# Patient Record
Sex: Female | Born: 1950 | Race: White | Hispanic: No | Marital: Married | Smoking: Never smoker
Health system: Southern US, Community
[De-identification: ages and names within clinical notes are randomized; demographics above are authoritative.]

## PROBLEM LIST (undated history)

## (undated) DIAGNOSIS — M8580 Other specified disorders of bone density and structure, unspecified site: Secondary | ICD-10-CM

## (undated) DIAGNOSIS — N952 Postmenopausal atrophic vaginitis: Secondary | ICD-10-CM

## (undated) HISTORY — DX: Other specified disorders of bone density and structure, unspecified site: M85.80

## (undated) HISTORY — DX: Postmenopausal atrophic vaginitis: N95.2

## (undated) HISTORY — PX: TUBAL LIGATION: SHX77

## (undated) HISTORY — PX: WISDOM TOOTH EXTRACTION: SHX21

---

## 1999-11-05 ENCOUNTER — Other Ambulatory Visit: Admission: RE | Admit: 1999-11-05 | Discharge: 1999-11-05 | Payer: Self-pay | Admitting: Obstetrics and Gynecology

## 2000-01-20 ENCOUNTER — Encounter: Admission: RE | Admit: 2000-01-20 | Discharge: 2000-01-20 | Payer: Self-pay | Admitting: General Surgery

## 2000-01-20 ENCOUNTER — Encounter: Payer: Self-pay | Admitting: General Surgery

## 2000-01-20 ENCOUNTER — Encounter (INDEPENDENT_AMBULATORY_CARE_PROVIDER_SITE_OTHER): Payer: Self-pay | Admitting: Specialist

## 2000-01-20 ENCOUNTER — Other Ambulatory Visit: Admission: RE | Admit: 2000-01-20 | Discharge: 2000-01-20 | Payer: Self-pay

## 2000-08-04 ENCOUNTER — Encounter: Payer: Self-pay | Admitting: Obstetrics and Gynecology

## 2000-08-04 ENCOUNTER — Encounter: Admission: RE | Admit: 2000-08-04 | Discharge: 2000-08-04 | Payer: Self-pay | Admitting: Obstetrics and Gynecology

## 2000-09-28 ENCOUNTER — Encounter: Admission: RE | Admit: 2000-09-28 | Discharge: 2000-09-28 | Payer: Self-pay | Admitting: Obstetrics and Gynecology

## 2000-09-28 ENCOUNTER — Encounter: Payer: Self-pay | Admitting: Obstetrics and Gynecology

## 2001-01-04 ENCOUNTER — Other Ambulatory Visit: Admission: RE | Admit: 2001-01-04 | Discharge: 2001-01-04 | Payer: Self-pay | Admitting: Obstetrics and Gynecology

## 2001-10-06 ENCOUNTER — Encounter: Admission: RE | Admit: 2001-10-06 | Discharge: 2001-10-06 | Payer: Self-pay | Admitting: Obstetrics and Gynecology

## 2001-10-06 ENCOUNTER — Encounter: Payer: Self-pay | Admitting: Obstetrics and Gynecology

## 2002-02-01 ENCOUNTER — Other Ambulatory Visit: Admission: RE | Admit: 2002-02-01 | Discharge: 2002-02-01 | Payer: Self-pay | Admitting: Obstetrics and Gynecology

## 2002-10-11 ENCOUNTER — Encounter: Admission: RE | Admit: 2002-10-11 | Discharge: 2002-10-11 | Payer: Self-pay | Admitting: Obstetrics and Gynecology

## 2002-10-11 ENCOUNTER — Encounter: Payer: Self-pay | Admitting: Obstetrics and Gynecology

## 2003-04-02 ENCOUNTER — Other Ambulatory Visit: Admission: RE | Admit: 2003-04-02 | Discharge: 2003-04-02 | Payer: Self-pay | Admitting: Obstetrics and Gynecology

## 2005-03-04 ENCOUNTER — Encounter: Admission: RE | Admit: 2005-03-04 | Discharge: 2005-03-04 | Payer: Self-pay | Admitting: Obstetrics and Gynecology

## 2005-03-25 ENCOUNTER — Ambulatory Visit: Payer: Self-pay | Admitting: Gastroenterology

## 2005-04-16 ENCOUNTER — Ambulatory Visit: Payer: Self-pay | Admitting: Gastroenterology

## 2005-06-16 ENCOUNTER — Ambulatory Visit: Payer: Self-pay | Admitting: Gastroenterology

## 2005-07-02 ENCOUNTER — Ambulatory Visit: Payer: Self-pay | Admitting: Gastroenterology

## 2005-07-08 ENCOUNTER — Other Ambulatory Visit: Admission: RE | Admit: 2005-07-08 | Discharge: 2005-07-08 | Payer: Self-pay | Admitting: Obstetrics and Gynecology

## 2006-04-19 ENCOUNTER — Encounter: Admission: RE | Admit: 2006-04-19 | Discharge: 2006-04-19 | Payer: Self-pay | Admitting: Obstetrics and Gynecology

## 2007-04-28 ENCOUNTER — Encounter: Admission: RE | Admit: 2007-04-28 | Discharge: 2007-04-28 | Payer: Self-pay | Admitting: Obstetrics and Gynecology

## 2008-07-02 ENCOUNTER — Encounter: Admission: RE | Admit: 2008-07-02 | Discharge: 2008-07-02 | Payer: Self-pay | Admitting: Obstetrics and Gynecology

## 2008-07-10 ENCOUNTER — Encounter: Admission: RE | Admit: 2008-07-10 | Discharge: 2008-07-10 | Payer: Self-pay | Admitting: Obstetrics and Gynecology

## 2009-09-06 ENCOUNTER — Encounter: Admission: RE | Admit: 2009-09-06 | Discharge: 2009-09-06 | Payer: Self-pay | Admitting: Obstetrics and Gynecology

## 2010-10-31 ENCOUNTER — Other Ambulatory Visit: Payer: Self-pay | Admitting: Obstetrics and Gynecology

## 2010-10-31 DIAGNOSIS — Z1231 Encounter for screening mammogram for malignant neoplasm of breast: Secondary | ICD-10-CM

## 2010-11-26 ENCOUNTER — Ambulatory Visit
Admission: RE | Admit: 2010-11-26 | Discharge: 2010-11-26 | Disposition: A | Discharge status: Home / Self Care | Payer: Federal, State, Local not specified - PPO | Source: Ambulatory Visit | Attending: Obstetrics and Gynecology | Admitting: Obstetrics and Gynecology

## 2010-11-26 DIAGNOSIS — Z1231 Encounter for screening mammogram for malignant neoplasm of breast: Secondary | ICD-10-CM

## 2012-03-17 ENCOUNTER — Other Ambulatory Visit: Payer: Self-pay | Admitting: Obstetrics and Gynecology

## 2012-03-17 DIAGNOSIS — Z1231 Encounter for screening mammogram for malignant neoplasm of breast: Secondary | ICD-10-CM

## 2012-03-22 ENCOUNTER — Ambulatory Visit: Payer: Self-pay | Admitting: Obstetrics and Gynecology

## 2012-04-21 ENCOUNTER — Ambulatory Visit
Admission: RE | Admit: 2012-04-21 | Discharge: 2012-04-21 | Disposition: A | Discharge status: Home / Self Care | Payer: Self-pay | Source: Ambulatory Visit | Attending: Obstetrics and Gynecology | Admitting: Obstetrics and Gynecology

## 2012-04-21 DIAGNOSIS — Z1231 Encounter for screening mammogram for malignant neoplasm of breast: Secondary | ICD-10-CM

## 2012-05-25 ENCOUNTER — Ambulatory Visit (INDEPENDENT_AMBULATORY_CARE_PROVIDER_SITE_OTHER): Payer: BC Managed Care – PPO | Admitting: Obstetrics and Gynecology

## 2012-05-25 ENCOUNTER — Encounter: Payer: Self-pay | Admitting: Obstetrics and Gynecology

## 2012-05-25 VITALS — BP 100/60 | HR 76 | Ht 64.5 in | Wt 133.0 lb

## 2012-05-25 DIAGNOSIS — L900 Lichen sclerosus et atrophicus: Secondary | ICD-10-CM

## 2012-05-25 DIAGNOSIS — L940 Localized scleroderma [morphea]: Secondary | ICD-10-CM

## 2012-05-25 DIAGNOSIS — Z01419 Encounter for gynecological examination (general) (routine) without abnormal findings: Secondary | ICD-10-CM

## 2012-05-25 DIAGNOSIS — M8580 Other specified disorders of bone density and structure, unspecified site: Secondary | ICD-10-CM | POA: Insufficient documentation

## 2012-05-25 DIAGNOSIS — N952 Postmenopausal atrophic vaginitis: Secondary | ICD-10-CM | POA: Insufficient documentation

## 2012-05-25 DIAGNOSIS — Z124 Encounter for screening for malignant neoplasm of cervix: Secondary | ICD-10-CM

## 2012-05-25 HISTORY — DX: Lichen sclerosus et atrophicus: L90.0

## 2012-05-25 MED ORDER — ESTRADIOL 10 MCG VA TABS: 1.0000 | ORAL_TABLET | VAGINAL | Status: AC

## 2012-05-25 NOTE — Progress Notes (Signed)
The patient has never been taking hormone replacement therapy The patient  is taking a Calcium supplement. Post-menopausal bleeding:no  Last Pap: was normal July  2012 Last mammogram: was normal September  2013 Last DEXA scan : T= -2.04 February 2011 Last colonoscopy:was normal December 2003 Pt is scheduled for next colonoscopy December 2013  Urinary symptoms: none Normal bowel movements: Yes Reports abuse at home: No  Subjective:    Joann Curtis is a 61 y.o. female G3P3 who presents for annual exam. Known for vaginal atrophy and osteopenia The patient has no complaints today.   The following portions of the patient's history were reviewed and updated as appropriate: allergies, current medications, past family history, past medical history, past social history, past surgical history and problem list.  Review of Systems Pertinent items are noted in HPI. Gastrointestinal:No change in bowel habits, no abdominal pain, no rectal bleeding Genitourinary:negative for dysuria, frequency, hematuria, nocturia and urinary incontinence    Objective:     BP 100/60  Pulse 76  Ht 5' 4.5" (1.638 m)  Wt 133 lb (60.328 kg)  BMI 22.48 kg/m2  Weight:  Wt Readings from Last 1 Encounters:  05/25/12 133 lb (60.328 kg)     BMI: Body mass index is 22.48 kg/(m^2). General Appearance: Alert, appropriate appearance for age. No acute distress HEENT: Grossly normal Neck / Thyroid: Supple, no masses, nodes or enlargement Lungs: clear to auscultation bilaterally Back: No CVA tenderness Breast Exam: No masses or nodes.No dimpling, nipple retraction or discharge. Cardiovascular: Regular rate and rhythm. S1, S2, no murmur Gastrointestinal: Soft, non-tender, no masses or organomegaly Pelvic Exam: Vulva and vagina moderately atrophic. Bimanual exam reveals normal uterus and adnexa. Pt also treated for Lichen sclerosis by dermatologist Rectovaginal: normal rectal, no masses Lymphatic Exam: Non-palpable  nodes in neck, clavicular, axillary, or inguinal regions Skin: no rash or abnormalities Neurologic: Normal gait and speech, no tremor  Psychiatric: Alert and oriented, appropriate affect.   Assessment:    Normal gyn exam    Plan:   mammogram pap smear return annually or prn DEXA next year Follow-up:  for annual exam   Silverio Lay MD

## 2012-05-26 LAB — PAP IG W/ RFLX HPV ASCU

## 2012-08-01 ENCOUNTER — Encounter: Payer: Self-pay | Admitting: Internal Medicine

## 2012-12-07 HISTORY — PX: COLONOSCOPY: SHX174

## 2013-02-15 ENCOUNTER — Encounter: Payer: Self-pay | Admitting: Internal Medicine

## 2013-04-06 ENCOUNTER — Other Ambulatory Visit: Payer: Self-pay

## 2013-04-06 DIAGNOSIS — Z1231 Encounter for screening mammogram for malignant neoplasm of breast: Secondary | ICD-10-CM

## 2013-05-11 ENCOUNTER — Ambulatory Visit: Payer: Self-pay

## 2013-05-15 ENCOUNTER — Ambulatory Visit
Admission: RE | Admit: 2013-05-15 | Discharge: 2013-05-15 | Disposition: A | Discharge status: Home / Self Care | Payer: BC Managed Care – PPO | Source: Ambulatory Visit

## 2013-05-15 DIAGNOSIS — Z1231 Encounter for screening mammogram for malignant neoplasm of breast: Secondary | ICD-10-CM

## 2014-06-04 ENCOUNTER — Encounter: Payer: Self-pay | Admitting: Obstetrics and Gynecology

## 2014-07-13 ENCOUNTER — Other Ambulatory Visit: Payer: Self-pay

## 2014-07-13 DIAGNOSIS — Z1231 Encounter for screening mammogram for malignant neoplasm of breast: Secondary | ICD-10-CM

## 2014-08-22 ENCOUNTER — Ambulatory Visit
Admission: RE | Admit: 2014-08-22 | Discharge: 2014-08-22 | Disposition: A | Discharge status: Home / Self Care | Payer: Medicare HMO | Source: Ambulatory Visit

## 2014-08-22 ENCOUNTER — Encounter (INDEPENDENT_AMBULATORY_CARE_PROVIDER_SITE_OTHER): Payer: Self-pay

## 2014-08-22 DIAGNOSIS — Z1231 Encounter for screening mammogram for malignant neoplasm of breast: Secondary | ICD-10-CM

## 2015-08-23 ENCOUNTER — Other Ambulatory Visit: Payer: Self-pay

## 2015-08-23 DIAGNOSIS — Z1231 Encounter for screening mammogram for malignant neoplasm of breast: Secondary | ICD-10-CM

## 2015-10-09 ENCOUNTER — Ambulatory Visit
Admission: RE | Admit: 2015-10-09 | Discharge: 2015-10-09 | Disposition: A | Discharge status: Home / Self Care | Payer: PPO | Source: Ambulatory Visit

## 2015-10-09 DIAGNOSIS — Z1231 Encounter for screening mammogram for malignant neoplasm of breast: Secondary | ICD-10-CM

## 2016-08-11 DIAGNOSIS — Z0000 Encounter for general adult medical examination without abnormal findings: Secondary | ICD-10-CM | POA: Diagnosis not present

## 2016-08-11 DIAGNOSIS — J329 Chronic sinusitis, unspecified: Secondary | ICD-10-CM | POA: Diagnosis not present

## 2016-09-01 DIAGNOSIS — L900 Lichen sclerosus et atrophicus: Secondary | ICD-10-CM | POA: Diagnosis not present

## 2016-09-01 DIAGNOSIS — N952 Postmenopausal atrophic vaginitis: Secondary | ICD-10-CM | POA: Diagnosis not present

## 2016-09-14 DIAGNOSIS — Z6822 Body mass index (BMI) 22.0-22.9, adult: Secondary | ICD-10-CM | POA: Diagnosis not present

## 2016-09-14 DIAGNOSIS — N952 Postmenopausal atrophic vaginitis: Secondary | ICD-10-CM | POA: Diagnosis not present

## 2016-09-14 DIAGNOSIS — Z01419 Encounter for gynecological examination (general) (routine) without abnormal findings: Secondary | ICD-10-CM | POA: Diagnosis not present

## 2016-09-14 DIAGNOSIS — M8580 Other specified disorders of bone density and structure, unspecified site: Secondary | ICD-10-CM | POA: Diagnosis not present

## 2016-09-23 ENCOUNTER — Other Ambulatory Visit: Payer: Self-pay | Admitting: Family Medicine

## 2016-09-23 DIAGNOSIS — Z1231 Encounter for screening mammogram for malignant neoplasm of breast: Secondary | ICD-10-CM

## 2016-10-15 ENCOUNTER — Ambulatory Visit
Admission: RE | Admit: 2016-10-15 | Discharge: 2016-10-15 | Disposition: A | Discharge status: Home / Self Care | Payer: Medicare HMO | Source: Ambulatory Visit | Attending: Family Medicine | Admitting: Family Medicine

## 2016-10-15 DIAGNOSIS — Z1231 Encounter for screening mammogram for malignant neoplasm of breast: Secondary | ICD-10-CM | POA: Diagnosis not present

## 2016-12-01 DIAGNOSIS — Z13220 Encounter for screening for lipoid disorders: Secondary | ICD-10-CM | POA: Diagnosis not present

## 2016-12-01 DIAGNOSIS — E785 Hyperlipidemia, unspecified: Secondary | ICD-10-CM | POA: Diagnosis not present

## 2016-12-01 DIAGNOSIS — R5383 Other fatigue: Secondary | ICD-10-CM | POA: Diagnosis not present

## 2016-12-01 DIAGNOSIS — Z6821 Body mass index (BMI) 21.0-21.9, adult: Secondary | ICD-10-CM | POA: Diagnosis not present

## 2016-12-01 DIAGNOSIS — N390 Urinary tract infection, site not specified: Secondary | ICD-10-CM | POA: Diagnosis not present

## 2016-12-15 DIAGNOSIS — Z6821 Body mass index (BMI) 21.0-21.9, adult: Secondary | ICD-10-CM | POA: Diagnosis not present

## 2016-12-15 DIAGNOSIS — J029 Acute pharyngitis, unspecified: Secondary | ICD-10-CM | POA: Diagnosis not present

## 2016-12-21 DIAGNOSIS — Z6821 Body mass index (BMI) 21.0-21.9, adult: Secondary | ICD-10-CM | POA: Diagnosis not present

## 2016-12-21 DIAGNOSIS — N390 Urinary tract infection, site not specified: Secondary | ICD-10-CM | POA: Diagnosis not present

## 2016-12-21 DIAGNOSIS — K5900 Constipation, unspecified: Secondary | ICD-10-CM | POA: Diagnosis not present

## 2017-01-04 DIAGNOSIS — N390 Urinary tract infection, site not specified: Secondary | ICD-10-CM | POA: Diagnosis not present

## 2017-01-05 DIAGNOSIS — N390 Urinary tract infection, site not specified: Secondary | ICD-10-CM | POA: Diagnosis not present

## 2017-02-04 DIAGNOSIS — R238 Other skin changes: Secondary | ICD-10-CM | POA: Diagnosis not present

## 2017-02-04 DIAGNOSIS — Z1389 Encounter for screening for other disorder: Secondary | ICD-10-CM | POA: Diagnosis not present

## 2017-02-04 DIAGNOSIS — N3090 Cystitis, unspecified without hematuria: Secondary | ICD-10-CM | POA: Diagnosis not present

## 2017-02-04 DIAGNOSIS — Z6821 Body mass index (BMI) 21.0-21.9, adult: Secondary | ICD-10-CM | POA: Diagnosis not present

## 2017-02-04 DIAGNOSIS — Z9181 History of falling: Secondary | ICD-10-CM | POA: Diagnosis not present

## 2017-02-04 DIAGNOSIS — L659 Nonscarring hair loss, unspecified: Secondary | ICD-10-CM | POA: Diagnosis not present

## 2017-02-04 DIAGNOSIS — R5383 Other fatigue: Secondary | ICD-10-CM | POA: Diagnosis not present

## 2017-02-04 DIAGNOSIS — Z23 Encounter for immunization: Secondary | ICD-10-CM | POA: Diagnosis not present

## 2017-04-06 DIAGNOSIS — Z23 Encounter for immunization: Secondary | ICD-10-CM | POA: Diagnosis not present

## 2017-04-06 DIAGNOSIS — L650 Telogen effluvium: Secondary | ICD-10-CM | POA: Diagnosis not present

## 2017-04-06 DIAGNOSIS — L821 Other seborrheic keratosis: Secondary | ICD-10-CM | POA: Diagnosis not present

## 2017-04-06 DIAGNOSIS — Z6821 Body mass index (BMI) 21.0-21.9, adult: Secondary | ICD-10-CM | POA: Diagnosis not present

## 2017-04-06 DIAGNOSIS — L900 Lichen sclerosus et atrophicus: Secondary | ICD-10-CM | POA: Diagnosis not present

## 2017-04-06 DIAGNOSIS — N390 Urinary tract infection, site not specified: Secondary | ICD-10-CM | POA: Diagnosis not present

## 2017-06-09 DIAGNOSIS — H5213 Myopia, bilateral: Secondary | ICD-10-CM | POA: Diagnosis not present

## 2017-06-09 DIAGNOSIS — H21233 Degeneration of iris (pigmentary), bilateral: Secondary | ICD-10-CM | POA: Diagnosis not present

## 2017-06-09 DIAGNOSIS — H2513 Age-related nuclear cataract, bilateral: Secondary | ICD-10-CM | POA: Diagnosis not present

## 2017-06-15 DIAGNOSIS — Z23 Encounter for immunization: Secondary | ICD-10-CM | POA: Diagnosis not present

## 2017-06-28 DIAGNOSIS — S93491A Sprain of other ligament of right ankle, initial encounter: Secondary | ICD-10-CM | POA: Diagnosis not present

## 2017-08-31 DIAGNOSIS — L900 Lichen sclerosus et atrophicus: Secondary | ICD-10-CM | POA: Diagnosis not present

## 2017-09-14 DIAGNOSIS — H1032 Unspecified acute conjunctivitis, left eye: Secondary | ICD-10-CM | POA: Diagnosis not present

## 2017-09-14 DIAGNOSIS — H0100A Unspecified blepharitis right eye, upper and lower eyelids: Secondary | ICD-10-CM | POA: Diagnosis not present

## 2017-09-14 DIAGNOSIS — H0100B Unspecified blepharitis left eye, upper and lower eyelids: Secondary | ICD-10-CM | POA: Diagnosis not present

## 2017-09-30 DIAGNOSIS — M810 Age-related osteoporosis without current pathological fracture: Secondary | ICD-10-CM | POA: Diagnosis not present

## 2017-09-30 DIAGNOSIS — N952 Postmenopausal atrophic vaginitis: Secondary | ICD-10-CM | POA: Diagnosis not present

## 2017-09-30 DIAGNOSIS — Z01419 Encounter for gynecological examination (general) (routine) without abnormal findings: Secondary | ICD-10-CM | POA: Diagnosis not present

## 2017-09-30 DIAGNOSIS — Z1239 Encounter for other screening for malignant neoplasm of breast: Secondary | ICD-10-CM | POA: Diagnosis not present

## 2017-09-30 DIAGNOSIS — Z1211 Encounter for screening for malignant neoplasm of colon: Secondary | ICD-10-CM | POA: Diagnosis not present

## 2017-09-30 DIAGNOSIS — N904 Leukoplakia of vulva: Secondary | ICD-10-CM | POA: Diagnosis not present

## 2017-09-30 DIAGNOSIS — Z6822 Body mass index (BMI) 22.0-22.9, adult: Secondary | ICD-10-CM | POA: Diagnosis not present

## 2017-09-30 DIAGNOSIS — M8589 Other specified disorders of bone density and structure, multiple sites: Secondary | ICD-10-CM | POA: Diagnosis not present

## 2017-10-14 ENCOUNTER — Other Ambulatory Visit: Payer: Self-pay | Admitting: Obstetrics and Gynecology

## 2017-10-14 DIAGNOSIS — Z1231 Encounter for screening mammogram for malignant neoplasm of breast: Secondary | ICD-10-CM

## 2017-11-10 ENCOUNTER — Ambulatory Visit: Payer: Medicare HMO

## 2017-11-16 ENCOUNTER — Ambulatory Visit
Admission: RE | Admit: 2017-11-16 | Discharge: 2017-11-16 | Disposition: A | Discharge status: Home / Self Care | Payer: Medicare HMO | Source: Ambulatory Visit | Attending: Obstetrics and Gynecology | Admitting: Obstetrics and Gynecology

## 2017-11-16 DIAGNOSIS — Z1231 Encounter for screening mammogram for malignant neoplasm of breast: Secondary | ICD-10-CM

## 2017-12-07 DIAGNOSIS — L900 Lichen sclerosus et atrophicus: Secondary | ICD-10-CM | POA: Diagnosis not present

## 2018-01-11 DIAGNOSIS — E559 Vitamin D deficiency, unspecified: Secondary | ICD-10-CM | POA: Diagnosis not present

## 2018-06-16 DIAGNOSIS — D225 Melanocytic nevi of trunk: Secondary | ICD-10-CM | POA: Diagnosis not present

## 2018-06-16 DIAGNOSIS — L658 Other specified nonscarring hair loss: Secondary | ICD-10-CM | POA: Diagnosis not present

## 2018-06-16 DIAGNOSIS — Z23 Encounter for immunization: Secondary | ICD-10-CM | POA: Diagnosis not present

## 2018-06-16 DIAGNOSIS — L821 Other seborrheic keratosis: Secondary | ICD-10-CM | POA: Diagnosis not present

## 2018-06-16 DIAGNOSIS — L900 Lichen sclerosus et atrophicus: Secondary | ICD-10-CM | POA: Diagnosis not present

## 2018-06-16 DIAGNOSIS — L814 Other melanin hyperpigmentation: Secondary | ICD-10-CM | POA: Diagnosis not present

## 2018-06-21 DIAGNOSIS — H2513 Age-related nuclear cataract, bilateral: Secondary | ICD-10-CM | POA: Diagnosis not present

## 2018-06-21 DIAGNOSIS — H5213 Myopia, bilateral: Secondary | ICD-10-CM | POA: Diagnosis not present

## 2018-07-07 DIAGNOSIS — Z23 Encounter for immunization: Secondary | ICD-10-CM | POA: Diagnosis not present

## 2018-08-18 DIAGNOSIS — L659 Nonscarring hair loss, unspecified: Secondary | ICD-10-CM | POA: Diagnosis not present

## 2018-08-18 DIAGNOSIS — Z6823 Body mass index (BMI) 23.0-23.9, adult: Secondary | ICD-10-CM | POA: Diagnosis not present

## 2018-08-18 DIAGNOSIS — E785 Hyperlipidemia, unspecified: Secondary | ICD-10-CM | POA: Diagnosis not present

## 2018-08-18 DIAGNOSIS — E049 Nontoxic goiter, unspecified: Secondary | ICD-10-CM | POA: Diagnosis not present

## 2018-08-18 DIAGNOSIS — Z0000 Encounter for general adult medical examination without abnormal findings: Secondary | ICD-10-CM | POA: Diagnosis not present

## 2018-08-18 DIAGNOSIS — R5383 Other fatigue: Secondary | ICD-10-CM | POA: Diagnosis not present

## 2018-08-18 DIAGNOSIS — Z13220 Encounter for screening for lipoid disorders: Secondary | ICD-10-CM | POA: Diagnosis not present

## 2018-08-30 DIAGNOSIS — N952 Postmenopausal atrophic vaginitis: Secondary | ICD-10-CM | POA: Diagnosis not present

## 2018-08-30 DIAGNOSIS — L900 Lichen sclerosus et atrophicus: Secondary | ICD-10-CM | POA: Diagnosis not present

## 2018-09-01 DIAGNOSIS — Z0000 Encounter for general adult medical examination without abnormal findings: Secondary | ICD-10-CM | POA: Diagnosis not present

## 2018-09-01 DIAGNOSIS — E049 Nontoxic goiter, unspecified: Secondary | ICD-10-CM | POA: Diagnosis not present

## 2018-09-16 DIAGNOSIS — R5383 Other fatigue: Secondary | ICD-10-CM | POA: Diagnosis not present

## 2018-09-16 DIAGNOSIS — R5381 Other malaise: Secondary | ICD-10-CM | POA: Diagnosis not present

## 2018-09-16 DIAGNOSIS — R42 Dizziness and giddiness: Secondary | ICD-10-CM | POA: Diagnosis not present

## 2018-09-16 DIAGNOSIS — Z6822 Body mass index (BMI) 22.0-22.9, adult: Secondary | ICD-10-CM | POA: Diagnosis not present

## 2018-10-14 ENCOUNTER — Other Ambulatory Visit: Payer: Self-pay | Admitting: Obstetrics and Gynecology

## 2018-10-14 DIAGNOSIS — Z1231 Encounter for screening mammogram for malignant neoplasm of breast: Secondary | ICD-10-CM

## 2018-11-25 ENCOUNTER — Ambulatory Visit: Payer: Medicare HMO

## 2019-01-24 DIAGNOSIS — W57XXXA Bitten or stung by nonvenomous insect and other nonvenomous arthropods, initial encounter: Secondary | ICD-10-CM | POA: Diagnosis not present

## 2019-01-24 DIAGNOSIS — Z6823 Body mass index (BMI) 23.0-23.9, adult: Secondary | ICD-10-CM | POA: Diagnosis not present

## 2019-01-24 DIAGNOSIS — R21 Rash and other nonspecific skin eruption: Secondary | ICD-10-CM | POA: Diagnosis not present

## 2019-01-26 ENCOUNTER — Other Ambulatory Visit: Payer: Self-pay

## 2019-01-26 ENCOUNTER — Ambulatory Visit
Admission: RE | Admit: 2019-01-26 | Discharge: 2019-01-26 | Disposition: A | Discharge status: Home / Self Care | Payer: Medicare HMO | Source: Ambulatory Visit | Attending: Obstetrics and Gynecology | Admitting: Obstetrics and Gynecology

## 2019-01-26 DIAGNOSIS — Z1231 Encounter for screening mammogram for malignant neoplasm of breast: Secondary | ICD-10-CM

## 2019-03-20 DIAGNOSIS — N952 Postmenopausal atrophic vaginitis: Secondary | ICD-10-CM | POA: Diagnosis not present

## 2019-03-20 DIAGNOSIS — L814 Other melanin hyperpigmentation: Secondary | ICD-10-CM | POA: Diagnosis not present

## 2019-03-20 DIAGNOSIS — Z1211 Encounter for screening for malignant neoplasm of colon: Secondary | ICD-10-CM | POA: Diagnosis not present

## 2019-03-20 DIAGNOSIS — L900 Lichen sclerosus et atrophicus: Secondary | ICD-10-CM | POA: Diagnosis not present

## 2019-03-20 DIAGNOSIS — Z1239 Encounter for other screening for malignant neoplasm of breast: Secondary | ICD-10-CM | POA: Diagnosis not present

## 2019-03-20 DIAGNOSIS — Z01419 Encounter for gynecological examination (general) (routine) without abnormal findings: Secondary | ICD-10-CM | POA: Diagnosis not present

## 2019-03-20 DIAGNOSIS — Z6822 Body mass index (BMI) 22.0-22.9, adult: Secondary | ICD-10-CM | POA: Diagnosis not present

## 2019-03-20 DIAGNOSIS — M810 Age-related osteoporosis without current pathological fracture: Secondary | ICD-10-CM | POA: Diagnosis not present

## 2019-03-20 DIAGNOSIS — L309 Dermatitis, unspecified: Secondary | ICD-10-CM | POA: Diagnosis not present

## 2019-03-20 DIAGNOSIS — L821 Other seborrheic keratosis: Secondary | ICD-10-CM | POA: Diagnosis not present

## 2019-03-20 DIAGNOSIS — D225 Melanocytic nevi of trunk: Secondary | ICD-10-CM | POA: Diagnosis not present

## 2019-03-20 DIAGNOSIS — Z124 Encounter for screening for malignant neoplasm of cervix: Secondary | ICD-10-CM | POA: Diagnosis not present

## 2019-05-11 DIAGNOSIS — Z23 Encounter for immunization: Secondary | ICD-10-CM | POA: Diagnosis not present

## 2019-07-11 DIAGNOSIS — H2513 Age-related nuclear cataract, bilateral: Secondary | ICD-10-CM | POA: Diagnosis not present

## 2019-07-11 DIAGNOSIS — H5213 Myopia, bilateral: Secondary | ICD-10-CM | POA: Diagnosis not present

## 2019-07-21 DIAGNOSIS — Z03818 Encounter for observation for suspected exposure to other biological agents ruled out: Secondary | ICD-10-CM | POA: Diagnosis not present

## 2019-08-29 DIAGNOSIS — L900 Lichen sclerosus et atrophicus: Secondary | ICD-10-CM | POA: Diagnosis not present

## 2019-08-29 DIAGNOSIS — N952 Postmenopausal atrophic vaginitis: Secondary | ICD-10-CM | POA: Diagnosis not present

## 2019-10-02 DIAGNOSIS — Z13220 Encounter for screening for lipoid disorders: Secondary | ICD-10-CM | POA: Diagnosis not present

## 2019-10-02 DIAGNOSIS — R5383 Other fatigue: Secondary | ICD-10-CM | POA: Diagnosis not present

## 2019-10-02 DIAGNOSIS — Z6822 Body mass index (BMI) 22.0-22.9, adult: Secondary | ICD-10-CM | POA: Diagnosis not present

## 2019-10-02 DIAGNOSIS — Z79899 Other long term (current) drug therapy: Secondary | ICD-10-CM | POA: Diagnosis not present

## 2019-10-02 DIAGNOSIS — E559 Vitamin D deficiency, unspecified: Secondary | ICD-10-CM | POA: Diagnosis not present

## 2019-10-02 DIAGNOSIS — E785 Hyperlipidemia, unspecified: Secondary | ICD-10-CM | POA: Diagnosis not present

## 2019-10-02 DIAGNOSIS — N390 Urinary tract infection, site not specified: Secondary | ICD-10-CM | POA: Diagnosis not present

## 2019-12-19 DIAGNOSIS — L249 Irritant contact dermatitis, unspecified cause: Secondary | ICD-10-CM | POA: Diagnosis not present

## 2019-12-20 ENCOUNTER — Other Ambulatory Visit: Payer: Self-pay | Admitting: Obstetrics and Gynecology

## 2019-12-20 DIAGNOSIS — Z1231 Encounter for screening mammogram for malignant neoplasm of breast: Secondary | ICD-10-CM

## 2020-01-26 DIAGNOSIS — N952 Postmenopausal atrophic vaginitis: Secondary | ICD-10-CM | POA: Diagnosis not present

## 2020-01-26 DIAGNOSIS — L900 Lichen sclerosus et atrophicus: Secondary | ICD-10-CM | POA: Diagnosis not present

## 2020-02-07 ENCOUNTER — Other Ambulatory Visit: Payer: Self-pay

## 2020-02-07 ENCOUNTER — Ambulatory Visit
Admission: RE | Admit: 2020-02-07 | Discharge: 2020-02-07 | Disposition: A | Discharge status: Home / Self Care | Payer: Medicare HMO | Source: Ambulatory Visit | Attending: Obstetrics and Gynecology | Admitting: Obstetrics and Gynecology

## 2020-02-07 DIAGNOSIS — Z1231 Encounter for screening mammogram for malignant neoplasm of breast: Secondary | ICD-10-CM

## 2020-02-09 ENCOUNTER — Other Ambulatory Visit: Payer: Self-pay | Admitting: Obstetrics and Gynecology

## 2020-02-09 DIAGNOSIS — R928 Other abnormal and inconclusive findings on diagnostic imaging of breast: Secondary | ICD-10-CM

## 2020-03-01 ENCOUNTER — Other Ambulatory Visit: Payer: Self-pay

## 2020-03-01 ENCOUNTER — Ambulatory Visit: Payer: Medicare HMO

## 2020-03-01 ENCOUNTER — Ambulatory Visit
Admission: RE | Admit: 2020-03-01 | Discharge: 2020-03-01 | Disposition: A | Discharge status: Home / Self Care | Payer: Medicare HMO | Source: Ambulatory Visit | Attending: Obstetrics and Gynecology | Admitting: Obstetrics and Gynecology

## 2020-03-01 DIAGNOSIS — R928 Other abnormal and inconclusive findings on diagnostic imaging of breast: Secondary | ICD-10-CM

## 2020-03-01 DIAGNOSIS — R922 Inconclusive mammogram: Secondary | ICD-10-CM | POA: Diagnosis not present

## 2020-04-03 DIAGNOSIS — D225 Melanocytic nevi of trunk: Secondary | ICD-10-CM | POA: Diagnosis not present

## 2020-04-03 DIAGNOSIS — L900 Lichen sclerosus et atrophicus: Secondary | ICD-10-CM | POA: Diagnosis not present

## 2020-04-03 DIAGNOSIS — L821 Other seborrheic keratosis: Secondary | ICD-10-CM | POA: Diagnosis not present

## 2020-04-03 DIAGNOSIS — L814 Other melanin hyperpigmentation: Secondary | ICD-10-CM | POA: Diagnosis not present

## 2020-04-03 DIAGNOSIS — L578 Other skin changes due to chronic exposure to nonionizing radiation: Secondary | ICD-10-CM | POA: Diagnosis not present

## 2020-04-09 DIAGNOSIS — Z1211 Encounter for screening for malignant neoplasm of colon: Secondary | ICD-10-CM | POA: Diagnosis not present

## 2020-04-09 DIAGNOSIS — M810 Age-related osteoporosis without current pathological fracture: Secondary | ICD-10-CM | POA: Diagnosis not present

## 2020-04-09 DIAGNOSIS — Z6822 Body mass index (BMI) 22.0-22.9, adult: Secondary | ICD-10-CM | POA: Diagnosis not present

## 2020-04-09 DIAGNOSIS — N952 Postmenopausal atrophic vaginitis: Secondary | ICD-10-CM | POA: Diagnosis not present

## 2020-04-09 DIAGNOSIS — M8580 Other specified disorders of bone density and structure, unspecified site: Secondary | ICD-10-CM | POA: Diagnosis not present

## 2020-04-09 DIAGNOSIS — L900 Lichen sclerosus et atrophicus: Secondary | ICD-10-CM | POA: Diagnosis not present

## 2020-04-09 DIAGNOSIS — Z01419 Encounter for gynecological examination (general) (routine) without abnormal findings: Secondary | ICD-10-CM | POA: Diagnosis not present

## 2020-04-09 DIAGNOSIS — Z1321 Encounter for screening for nutritional disorder: Secondary | ICD-10-CM | POA: Diagnosis not present

## 2020-04-09 DIAGNOSIS — Z1239 Encounter for other screening for malignant neoplasm of breast: Secondary | ICD-10-CM | POA: Diagnosis not present

## 2020-07-16 DIAGNOSIS — H5213 Myopia, bilateral: Secondary | ICD-10-CM | POA: Diagnosis not present

## 2020-07-16 DIAGNOSIS — H2513 Age-related nuclear cataract, bilateral: Secondary | ICD-10-CM | POA: Diagnosis not present

## 2020-07-16 DIAGNOSIS — H43813 Vitreous degeneration, bilateral: Secondary | ICD-10-CM | POA: Diagnosis not present

## 2020-07-19 DIAGNOSIS — R69 Illness, unspecified: Secondary | ICD-10-CM | POA: Diagnosis not present

## 2020-07-31 DIAGNOSIS — Z03818 Encounter for observation for suspected exposure to other biological agents ruled out: Secondary | ICD-10-CM | POA: Diagnosis not present

## 2020-07-31 DIAGNOSIS — Z20822 Contact with and (suspected) exposure to covid-19: Secondary | ICD-10-CM | POA: Diagnosis not present

## 2020-08-20 DIAGNOSIS — L900 Lichen sclerosus et atrophicus: Secondary | ICD-10-CM | POA: Diagnosis not present

## 2020-08-20 DIAGNOSIS — N952 Postmenopausal atrophic vaginitis: Secondary | ICD-10-CM | POA: Diagnosis not present

## 2020-10-15 DIAGNOSIS — N390 Urinary tract infection, site not specified: Secondary | ICD-10-CM | POA: Diagnosis not present

## 2020-10-15 DIAGNOSIS — Z6822 Body mass index (BMI) 22.0-22.9, adult: Secondary | ICD-10-CM | POA: Diagnosis not present

## 2020-10-15 DIAGNOSIS — Z0000 Encounter for general adult medical examination without abnormal findings: Secondary | ICD-10-CM | POA: Diagnosis not present

## 2020-11-19 DIAGNOSIS — L900 Lichen sclerosus et atrophicus: Secondary | ICD-10-CM | POA: Diagnosis not present

## 2020-11-19 DIAGNOSIS — N952 Postmenopausal atrophic vaginitis: Secondary | ICD-10-CM | POA: Diagnosis not present

## 2020-12-19 IMAGING — MG MM DIGITAL DIAGNOSTIC UNILAT*L* W/ TOMO W/ CAD
6 series · 6 of 18 positions shown · non-contrast
Comparison: 02/07/2020 and earlier

CLINICAL DATA: Patient returns after screening study for evaluation
possible LEFT breast asymmetry.

EXAM:
DIGITAL DIAGNOSTIC UNILATERAL LEFT MAMMOGRAM WITH TOMO AND CAD

[L CC synth-2D]
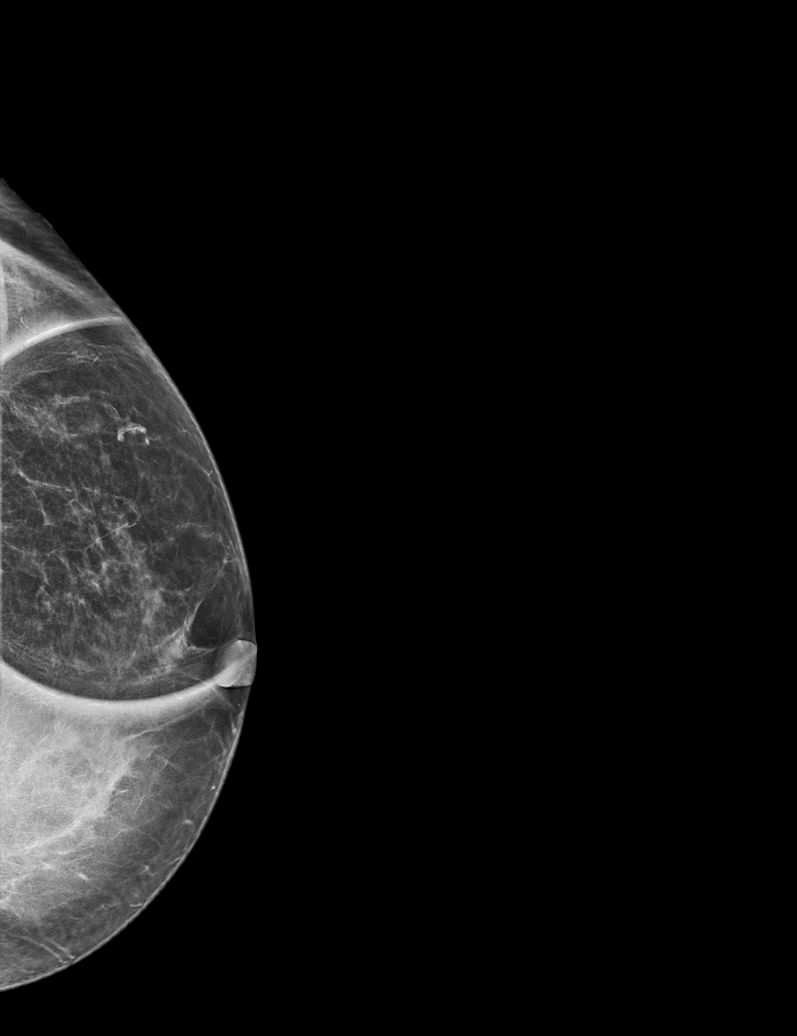

[L MLO synth-2D]
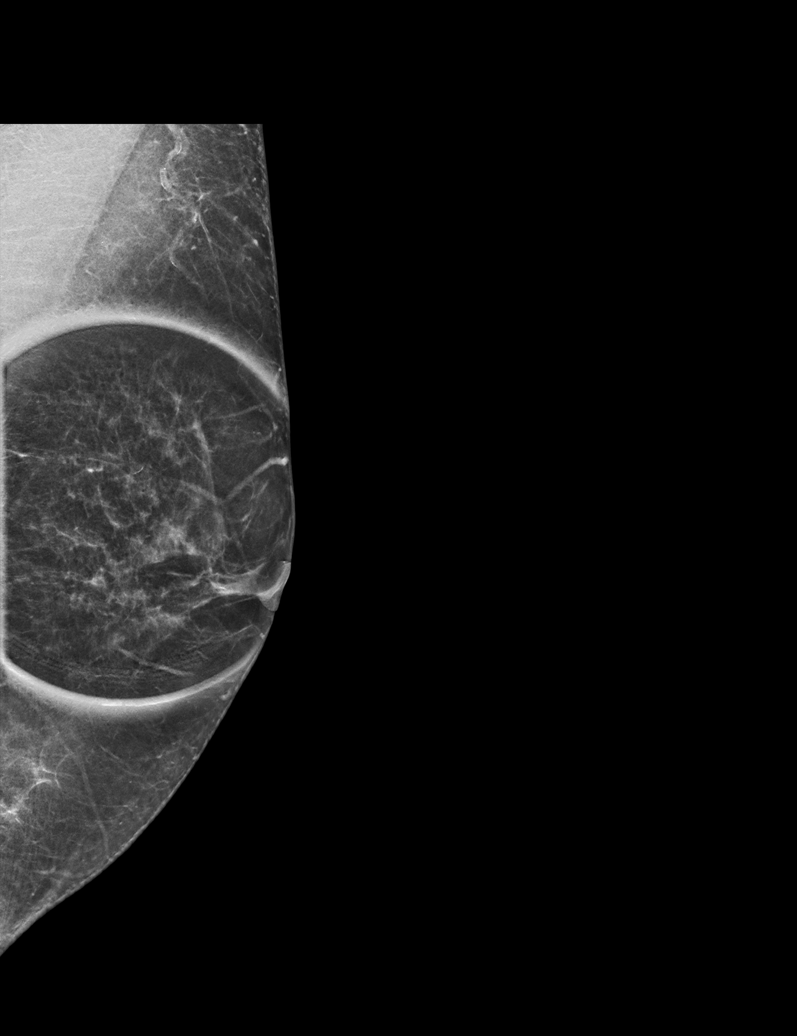

[L XCCL synth-2D]
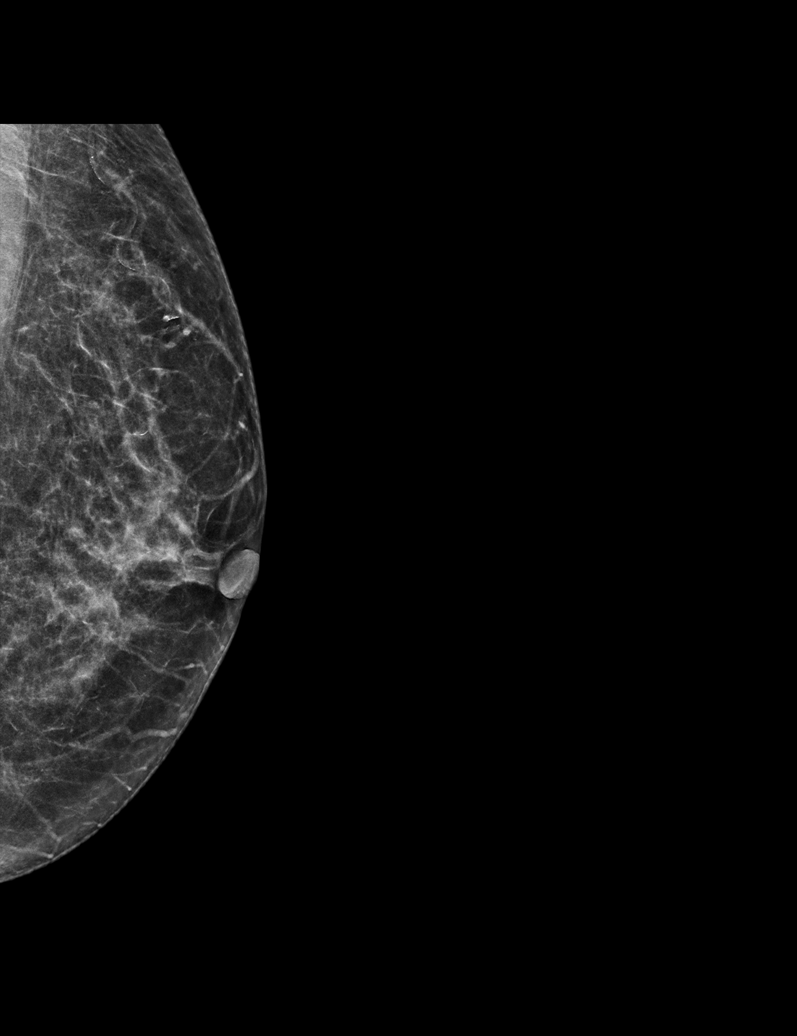

[L CC tomo · tomo slice 27/52.0]
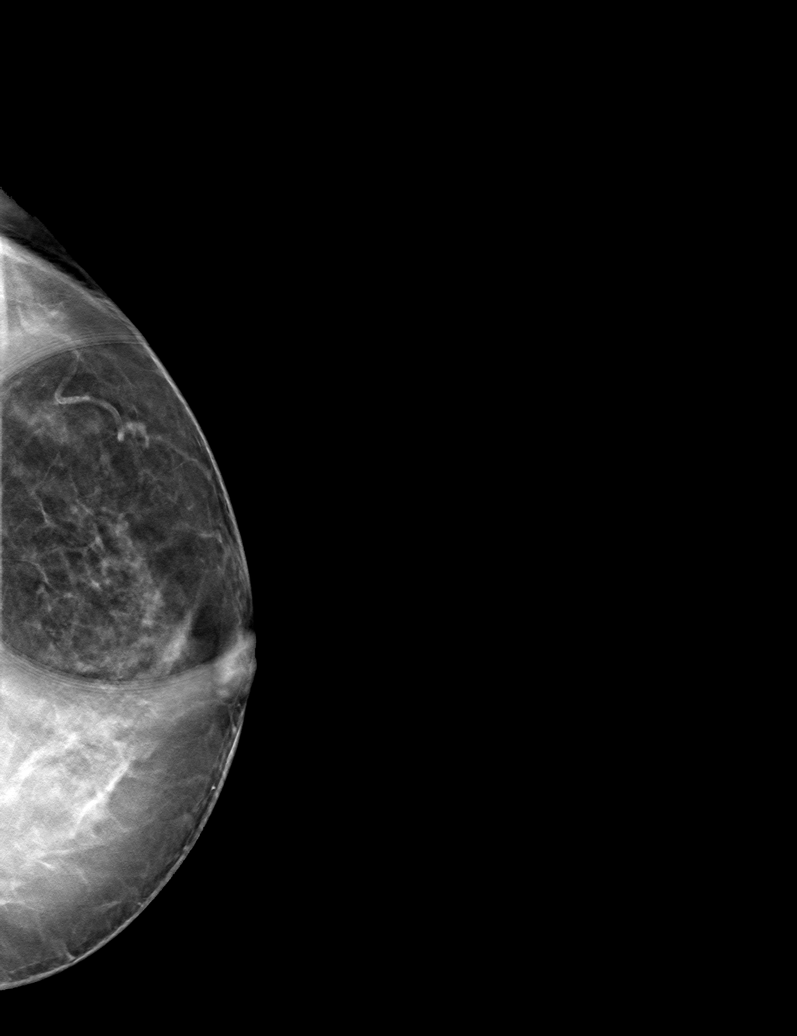

[L MLO tomo · tomo slice 25/49.0]
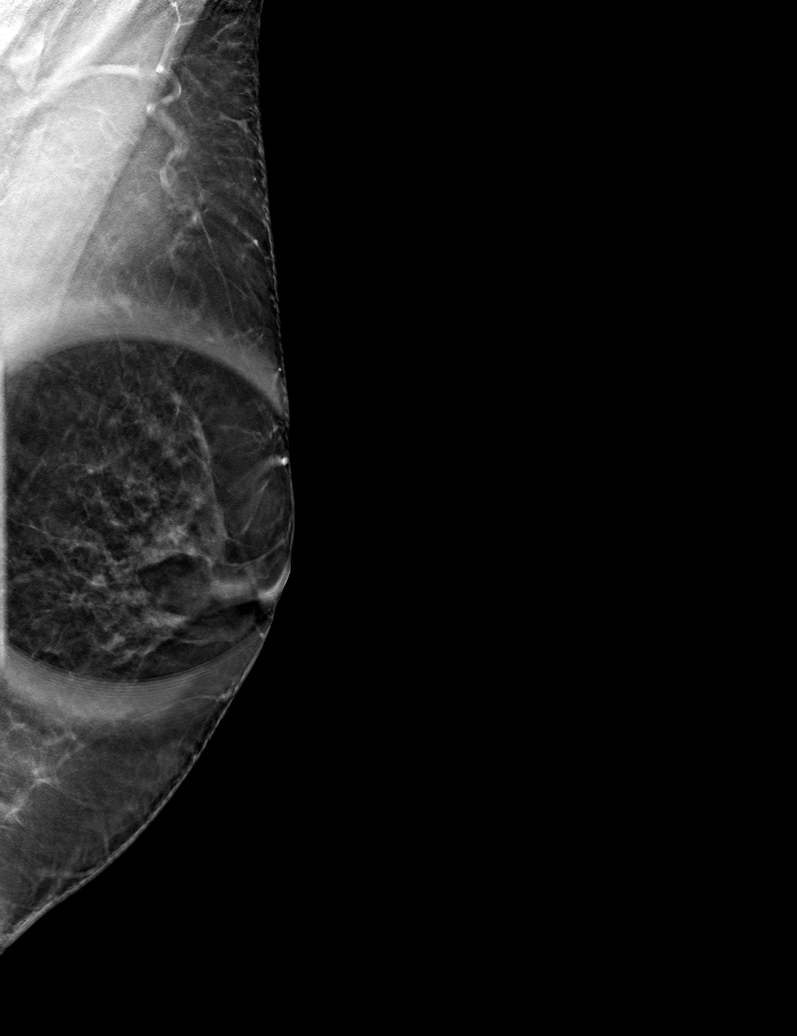

[L XCCL tomo · tomo slice 28/55.0]
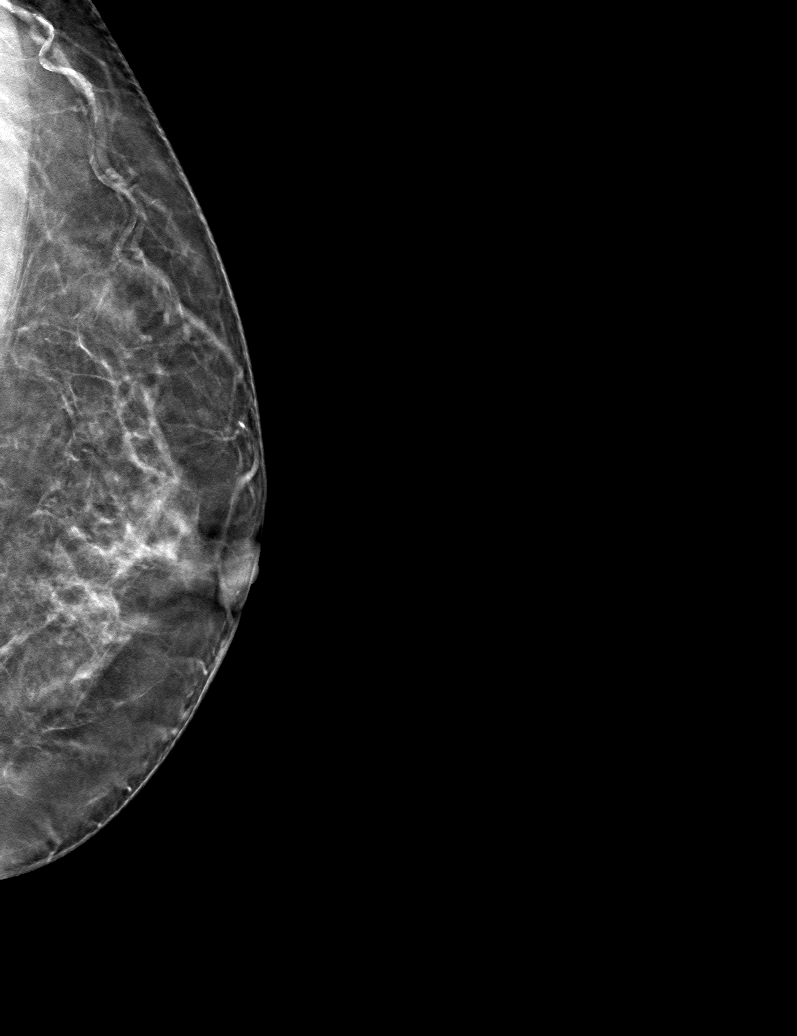

[6 of 18 positions shown; findings below may reference images not displayed]

ACR Breast Density Category c: The breast tissue is heterogeneously
dense, which may obscure small masses.
FINDINGS: Additional 2-D and 3-D images are performed. These views show no
persistent asymmetry in the LEFT breast. No suspicious mass,
distortion, or microcalcifications are identified to suggest
presence of malignancy.

Mammographic images were processed with CAD.
IMPRESSION: No mammographic evidence for malignancy.

RECOMMENDATION:
Screening mammogram in one year.(Code:R2-3-6J0)

I have discussed the findings and recommendations with the patient.
If applicable, a reminder letter will be sent to the patient
regarding the next appointment.

BI-RADS CATEGORY  1: Negative.

## 2020-12-24 DIAGNOSIS — Z23 Encounter for immunization: Secondary | ICD-10-CM | POA: Diagnosis not present

## 2020-12-31 DIAGNOSIS — N3090 Cystitis, unspecified without hematuria: Secondary | ICD-10-CM | POA: Diagnosis not present

## 2020-12-31 DIAGNOSIS — Z1331 Encounter for screening for depression: Secondary | ICD-10-CM | POA: Diagnosis not present

## 2020-12-31 DIAGNOSIS — Z6822 Body mass index (BMI) 22.0-22.9, adult: Secondary | ICD-10-CM | POA: Diagnosis not present

## 2021-01-10 DIAGNOSIS — Z20822 Contact with and (suspected) exposure to covid-19: Secondary | ICD-10-CM | POA: Diagnosis not present

## 2021-02-01 DIAGNOSIS — Z0100 Encounter for examination of eyes and vision without abnormal findings: Secondary | ICD-10-CM | POA: Diagnosis not present

## 2021-03-06 ENCOUNTER — Other Ambulatory Visit: Payer: Self-pay | Admitting: Family Medicine

## 2021-03-06 DIAGNOSIS — Z1231 Encounter for screening mammogram for malignant neoplasm of breast: Secondary | ICD-10-CM

## 2021-03-26 DIAGNOSIS — M5442 Lumbago with sciatica, left side: Secondary | ICD-10-CM | POA: Diagnosis not present

## 2021-04-04 DIAGNOSIS — M9904 Segmental and somatic dysfunction of sacral region: Secondary | ICD-10-CM | POA: Diagnosis not present

## 2021-04-04 DIAGNOSIS — M9903 Segmental and somatic dysfunction of lumbar region: Secondary | ICD-10-CM | POA: Diagnosis not present

## 2021-04-04 DIAGNOSIS — M5136 Other intervertebral disc degeneration, lumbar region: Secondary | ICD-10-CM | POA: Diagnosis not present

## 2021-04-04 DIAGNOSIS — M5430 Sciatica, unspecified side: Secondary | ICD-10-CM | POA: Diagnosis not present

## 2021-04-04 DIAGNOSIS — M9902 Segmental and somatic dysfunction of thoracic region: Secondary | ICD-10-CM | POA: Diagnosis not present

## 2021-04-04 DIAGNOSIS — M9901 Segmental and somatic dysfunction of cervical region: Secondary | ICD-10-CM | POA: Diagnosis not present

## 2021-04-21 DIAGNOSIS — M9903 Segmental and somatic dysfunction of lumbar region: Secondary | ICD-10-CM | POA: Diagnosis not present

## 2021-04-21 DIAGNOSIS — M9902 Segmental and somatic dysfunction of thoracic region: Secondary | ICD-10-CM | POA: Diagnosis not present

## 2021-04-21 DIAGNOSIS — Z13220 Encounter for screening for lipoid disorders: Secondary | ICD-10-CM | POA: Diagnosis not present

## 2021-04-21 DIAGNOSIS — Z139 Encounter for screening, unspecified: Secondary | ICD-10-CM | POA: Diagnosis not present

## 2021-04-21 DIAGNOSIS — M9904 Segmental and somatic dysfunction of sacral region: Secondary | ICD-10-CM | POA: Diagnosis not present

## 2021-04-21 DIAGNOSIS — M9901 Segmental and somatic dysfunction of cervical region: Secondary | ICD-10-CM | POA: Diagnosis not present

## 2021-04-21 DIAGNOSIS — Z0000 Encounter for general adult medical examination without abnormal findings: Secondary | ICD-10-CM | POA: Diagnosis not present

## 2021-04-21 DIAGNOSIS — M5136 Other intervertebral disc degeneration, lumbar region: Secondary | ICD-10-CM | POA: Diagnosis not present

## 2021-04-29 DIAGNOSIS — M5417 Radiculopathy, lumbosacral region: Secondary | ICD-10-CM | POA: Diagnosis not present

## 2021-04-29 DIAGNOSIS — M5442 Lumbago with sciatica, left side: Secondary | ICD-10-CM | POA: Diagnosis not present

## 2021-04-30 DIAGNOSIS — M9902 Segmental and somatic dysfunction of thoracic region: Secondary | ICD-10-CM | POA: Diagnosis not present

## 2021-04-30 DIAGNOSIS — M9903 Segmental and somatic dysfunction of lumbar region: Secondary | ICD-10-CM | POA: Diagnosis not present

## 2021-04-30 DIAGNOSIS — M5136 Other intervertebral disc degeneration, lumbar region: Secondary | ICD-10-CM | POA: Diagnosis not present

## 2021-04-30 DIAGNOSIS — M9901 Segmental and somatic dysfunction of cervical region: Secondary | ICD-10-CM | POA: Diagnosis not present

## 2021-04-30 DIAGNOSIS — M9904 Segmental and somatic dysfunction of sacral region: Secondary | ICD-10-CM | POA: Diagnosis not present

## 2021-05-01 ENCOUNTER — Ambulatory Visit
Admission: RE | Admit: 2021-05-01 | Discharge: 2021-05-01 | Disposition: A | Discharge status: Home / Self Care | Payer: Medicare HMO | Source: Ambulatory Visit | Attending: Family Medicine | Admitting: Family Medicine

## 2021-05-01 ENCOUNTER — Other Ambulatory Visit: Payer: Self-pay

## 2021-05-01 DIAGNOSIS — Z1231 Encounter for screening mammogram for malignant neoplasm of breast: Secondary | ICD-10-CM

## 2021-05-02 DIAGNOSIS — M1990 Unspecified osteoarthritis, unspecified site: Secondary | ICD-10-CM | POA: Diagnosis not present

## 2021-05-02 DIAGNOSIS — Z6823 Body mass index (BMI) 23.0-23.9, adult: Secondary | ICD-10-CM | POA: Diagnosis not present

## 2021-05-02 DIAGNOSIS — N3090 Cystitis, unspecified without hematuria: Secondary | ICD-10-CM | POA: Diagnosis not present

## 2021-05-02 DIAGNOSIS — M810 Age-related osteoporosis without current pathological fracture: Secondary | ICD-10-CM | POA: Diagnosis not present

## 2021-05-05 DIAGNOSIS — M9902 Segmental and somatic dysfunction of thoracic region: Secondary | ICD-10-CM | POA: Diagnosis not present

## 2021-05-05 DIAGNOSIS — M5136 Other intervertebral disc degeneration, lumbar region: Secondary | ICD-10-CM | POA: Diagnosis not present

## 2021-05-05 DIAGNOSIS — M9903 Segmental and somatic dysfunction of lumbar region: Secondary | ICD-10-CM | POA: Diagnosis not present

## 2021-05-05 DIAGNOSIS — M9904 Segmental and somatic dysfunction of sacral region: Secondary | ICD-10-CM | POA: Diagnosis not present

## 2021-05-05 DIAGNOSIS — M9901 Segmental and somatic dysfunction of cervical region: Secondary | ICD-10-CM | POA: Diagnosis not present

## 2021-05-06 DIAGNOSIS — L814 Other melanin hyperpigmentation: Secondary | ICD-10-CM | POA: Diagnosis not present

## 2021-05-06 DIAGNOSIS — Z23 Encounter for immunization: Secondary | ICD-10-CM | POA: Diagnosis not present

## 2021-05-06 DIAGNOSIS — L900 Lichen sclerosus et atrophicus: Secondary | ICD-10-CM | POA: Diagnosis not present

## 2021-05-06 DIAGNOSIS — L578 Other skin changes due to chronic exposure to nonionizing radiation: Secondary | ICD-10-CM | POA: Diagnosis not present

## 2021-05-06 DIAGNOSIS — D225 Melanocytic nevi of trunk: Secondary | ICD-10-CM | POA: Diagnosis not present

## 2021-05-06 DIAGNOSIS — L821 Other seborrheic keratosis: Secondary | ICD-10-CM | POA: Diagnosis not present

## 2021-05-07 DIAGNOSIS — M5136 Other intervertebral disc degeneration, lumbar region: Secondary | ICD-10-CM | POA: Diagnosis not present

## 2021-05-07 DIAGNOSIS — M9902 Segmental and somatic dysfunction of thoracic region: Secondary | ICD-10-CM | POA: Diagnosis not present

## 2021-05-07 DIAGNOSIS — M9903 Segmental and somatic dysfunction of lumbar region: Secondary | ICD-10-CM | POA: Diagnosis not present

## 2021-05-07 DIAGNOSIS — M9901 Segmental and somatic dysfunction of cervical region: Secondary | ICD-10-CM | POA: Diagnosis not present

## 2021-05-07 DIAGNOSIS — M9904 Segmental and somatic dysfunction of sacral region: Secondary | ICD-10-CM | POA: Diagnosis not present

## 2021-05-08 DIAGNOSIS — M5417 Radiculopathy, lumbosacral region: Secondary | ICD-10-CM | POA: Diagnosis not present

## 2021-05-08 DIAGNOSIS — M47816 Spondylosis without myelopathy or radiculopathy, lumbar region: Secondary | ICD-10-CM | POA: Diagnosis not present

## 2021-05-13 DIAGNOSIS — Z23 Encounter for immunization: Secondary | ICD-10-CM | POA: Diagnosis not present

## 2021-05-14 DIAGNOSIS — M9903 Segmental and somatic dysfunction of lumbar region: Secondary | ICD-10-CM | POA: Diagnosis not present

## 2021-05-14 DIAGNOSIS — M9902 Segmental and somatic dysfunction of thoracic region: Secondary | ICD-10-CM | POA: Diagnosis not present

## 2021-05-14 DIAGNOSIS — M5136 Other intervertebral disc degeneration, lumbar region: Secondary | ICD-10-CM | POA: Diagnosis not present

## 2021-05-14 DIAGNOSIS — M9904 Segmental and somatic dysfunction of sacral region: Secondary | ICD-10-CM | POA: Diagnosis not present

## 2021-05-14 DIAGNOSIS — M9901 Segmental and somatic dysfunction of cervical region: Secondary | ICD-10-CM | POA: Diagnosis not present

## 2021-05-19 DIAGNOSIS — M9903 Segmental and somatic dysfunction of lumbar region: Secondary | ICD-10-CM | POA: Diagnosis not present

## 2021-05-19 DIAGNOSIS — M5136 Other intervertebral disc degeneration, lumbar region: Secondary | ICD-10-CM | POA: Diagnosis not present

## 2021-05-19 DIAGNOSIS — M9902 Segmental and somatic dysfunction of thoracic region: Secondary | ICD-10-CM | POA: Diagnosis not present

## 2021-05-19 DIAGNOSIS — M9901 Segmental and somatic dysfunction of cervical region: Secondary | ICD-10-CM | POA: Diagnosis not present

## 2021-05-19 DIAGNOSIS — M9904 Segmental and somatic dysfunction of sacral region: Secondary | ICD-10-CM | POA: Diagnosis not present

## 2021-05-21 DIAGNOSIS — M9904 Segmental and somatic dysfunction of sacral region: Secondary | ICD-10-CM | POA: Diagnosis not present

## 2021-05-21 DIAGNOSIS — M5136 Other intervertebral disc degeneration, lumbar region: Secondary | ICD-10-CM | POA: Diagnosis not present

## 2021-05-21 DIAGNOSIS — M9901 Segmental and somatic dysfunction of cervical region: Secondary | ICD-10-CM | POA: Diagnosis not present

## 2021-05-21 DIAGNOSIS — M9902 Segmental and somatic dysfunction of thoracic region: Secondary | ICD-10-CM | POA: Diagnosis not present

## 2021-05-26 DIAGNOSIS — M5442 Lumbago with sciatica, left side: Secondary | ICD-10-CM | POA: Diagnosis not present

## 2021-05-26 DIAGNOSIS — M9902 Segmental and somatic dysfunction of thoracic region: Secondary | ICD-10-CM | POA: Diagnosis not present

## 2021-05-26 DIAGNOSIS — M5136 Other intervertebral disc degeneration, lumbar region: Secondary | ICD-10-CM | POA: Diagnosis not present

## 2021-05-26 DIAGNOSIS — M9901 Segmental and somatic dysfunction of cervical region: Secondary | ICD-10-CM | POA: Diagnosis not present

## 2021-05-26 DIAGNOSIS — M9903 Segmental and somatic dysfunction of lumbar region: Secondary | ICD-10-CM | POA: Diagnosis not present

## 2021-05-26 DIAGNOSIS — M5417 Radiculopathy, lumbosacral region: Secondary | ICD-10-CM | POA: Diagnosis not present

## 2021-05-26 DIAGNOSIS — M9904 Segmental and somatic dysfunction of sacral region: Secondary | ICD-10-CM | POA: Diagnosis not present

## 2021-06-02 DIAGNOSIS — M5136 Other intervertebral disc degeneration, lumbar region: Secondary | ICD-10-CM | POA: Diagnosis not present

## 2021-06-02 DIAGNOSIS — M9901 Segmental and somatic dysfunction of cervical region: Secondary | ICD-10-CM | POA: Diagnosis not present

## 2021-06-02 DIAGNOSIS — M9903 Segmental and somatic dysfunction of lumbar region: Secondary | ICD-10-CM | POA: Diagnosis not present

## 2021-06-02 DIAGNOSIS — M9902 Segmental and somatic dysfunction of thoracic region: Secondary | ICD-10-CM | POA: Diagnosis not present

## 2021-06-02 DIAGNOSIS — M9904 Segmental and somatic dysfunction of sacral region: Secondary | ICD-10-CM | POA: Diagnosis not present

## 2021-06-03 DIAGNOSIS — L900 Lichen sclerosus et atrophicus: Secondary | ICD-10-CM | POA: Diagnosis not present

## 2021-06-03 DIAGNOSIS — N952 Postmenopausal atrophic vaginitis: Secondary | ICD-10-CM | POA: Diagnosis not present

## 2021-06-04 DIAGNOSIS — M5442 Lumbago with sciatica, left side: Secondary | ICD-10-CM | POA: Diagnosis not present

## 2021-06-04 DIAGNOSIS — M545 Low back pain, unspecified: Secondary | ICD-10-CM | POA: Diagnosis not present

## 2021-06-04 DIAGNOSIS — M4005 Postural kyphosis, thoracolumbar region: Secondary | ICD-10-CM | POA: Diagnosis not present

## 2021-06-09 DIAGNOSIS — M5136 Other intervertebral disc degeneration, lumbar region: Secondary | ICD-10-CM | POA: Diagnosis not present

## 2021-06-09 DIAGNOSIS — M9901 Segmental and somatic dysfunction of cervical region: Secondary | ICD-10-CM | POA: Diagnosis not present

## 2021-06-09 DIAGNOSIS — M9902 Segmental and somatic dysfunction of thoracic region: Secondary | ICD-10-CM | POA: Diagnosis not present

## 2021-06-09 DIAGNOSIS — M9903 Segmental and somatic dysfunction of lumbar region: Secondary | ICD-10-CM | POA: Diagnosis not present

## 2021-06-09 DIAGNOSIS — M9904 Segmental and somatic dysfunction of sacral region: Secondary | ICD-10-CM | POA: Diagnosis not present

## 2021-06-10 DIAGNOSIS — M5442 Lumbago with sciatica, left side: Secondary | ICD-10-CM | POA: Diagnosis not present

## 2021-06-10 DIAGNOSIS — M4005 Postural kyphosis, thoracolumbar region: Secondary | ICD-10-CM | POA: Diagnosis not present

## 2021-06-10 DIAGNOSIS — M545 Low back pain, unspecified: Secondary | ICD-10-CM | POA: Diagnosis not present

## 2021-06-12 DIAGNOSIS — M5442 Lumbago with sciatica, left side: Secondary | ICD-10-CM | POA: Diagnosis not present

## 2021-06-12 DIAGNOSIS — M545 Low back pain, unspecified: Secondary | ICD-10-CM | POA: Diagnosis not present

## 2021-06-12 DIAGNOSIS — M4005 Postural kyphosis, thoracolumbar region: Secondary | ICD-10-CM | POA: Diagnosis not present

## 2021-06-18 DIAGNOSIS — M9902 Segmental and somatic dysfunction of thoracic region: Secondary | ICD-10-CM | POA: Diagnosis not present

## 2021-06-18 DIAGNOSIS — M9903 Segmental and somatic dysfunction of lumbar region: Secondary | ICD-10-CM | POA: Diagnosis not present

## 2021-06-18 DIAGNOSIS — M9904 Segmental and somatic dysfunction of sacral region: Secondary | ICD-10-CM | POA: Diagnosis not present

## 2021-06-18 DIAGNOSIS — M5136 Other intervertebral disc degeneration, lumbar region: Secondary | ICD-10-CM | POA: Diagnosis not present

## 2021-06-18 DIAGNOSIS — M9901 Segmental and somatic dysfunction of cervical region: Secondary | ICD-10-CM | POA: Diagnosis not present

## 2021-06-19 DIAGNOSIS — M545 Low back pain, unspecified: Secondary | ICD-10-CM | POA: Diagnosis not present

## 2021-06-19 DIAGNOSIS — M5442 Lumbago with sciatica, left side: Secondary | ICD-10-CM | POA: Diagnosis not present

## 2021-06-19 DIAGNOSIS — M4005 Postural kyphosis, thoracolumbar region: Secondary | ICD-10-CM | POA: Diagnosis not present

## 2021-06-24 DIAGNOSIS — M545 Low back pain, unspecified: Secondary | ICD-10-CM | POA: Diagnosis not present

## 2021-06-24 DIAGNOSIS — M4005 Postural kyphosis, thoracolumbar region: Secondary | ICD-10-CM | POA: Diagnosis not present

## 2021-06-24 DIAGNOSIS — M5442 Lumbago with sciatica, left side: Secondary | ICD-10-CM | POA: Diagnosis not present

## 2021-07-02 DIAGNOSIS — M545 Low back pain, unspecified: Secondary | ICD-10-CM | POA: Diagnosis not present

## 2021-07-02 DIAGNOSIS — M5442 Lumbago with sciatica, left side: Secondary | ICD-10-CM | POA: Diagnosis not present

## 2021-07-02 DIAGNOSIS — M4005 Postural kyphosis, thoracolumbar region: Secondary | ICD-10-CM | POA: Diagnosis not present

## 2021-07-04 DIAGNOSIS — M9904 Segmental and somatic dysfunction of sacral region: Secondary | ICD-10-CM | POA: Diagnosis not present

## 2021-07-04 DIAGNOSIS — M9902 Segmental and somatic dysfunction of thoracic region: Secondary | ICD-10-CM | POA: Diagnosis not present

## 2021-07-04 DIAGNOSIS — M9901 Segmental and somatic dysfunction of cervical region: Secondary | ICD-10-CM | POA: Diagnosis not present

## 2021-07-04 DIAGNOSIS — M9903 Segmental and somatic dysfunction of lumbar region: Secondary | ICD-10-CM | POA: Diagnosis not present

## 2021-07-04 DIAGNOSIS — M5136 Other intervertebral disc degeneration, lumbar region: Secondary | ICD-10-CM | POA: Diagnosis not present

## 2021-08-11 DIAGNOSIS — Z6824 Body mass index (BMI) 24.0-24.9, adult: Secondary | ICD-10-CM | POA: Diagnosis not present

## 2021-08-11 DIAGNOSIS — K5900 Constipation, unspecified: Secondary | ICD-10-CM | POA: Diagnosis not present

## 2021-08-11 DIAGNOSIS — M810 Age-related osteoporosis without current pathological fracture: Secondary | ICD-10-CM | POA: Diagnosis not present

## 2021-08-13 DIAGNOSIS — H5213 Myopia, bilateral: Secondary | ICD-10-CM | POA: Diagnosis not present

## 2021-08-22 DIAGNOSIS — Z23 Encounter for immunization: Secondary | ICD-10-CM | POA: Diagnosis not present

## 2021-10-02 DIAGNOSIS — H25811 Combined forms of age-related cataract, right eye: Secondary | ICD-10-CM | POA: Diagnosis not present

## 2021-10-02 DIAGNOSIS — H2511 Age-related nuclear cataract, right eye: Secondary | ICD-10-CM | POA: Diagnosis not present

## 2021-10-09 DIAGNOSIS — H25812 Combined forms of age-related cataract, left eye: Secondary | ICD-10-CM | POA: Diagnosis not present

## 2021-10-09 DIAGNOSIS — H2512 Age-related nuclear cataract, left eye: Secondary | ICD-10-CM | POA: Diagnosis not present

## 2021-10-24 DIAGNOSIS — N952 Postmenopausal atrophic vaginitis: Secondary | ICD-10-CM | POA: Diagnosis not present

## 2021-10-24 DIAGNOSIS — L900 Lichen sclerosus et atrophicus: Secondary | ICD-10-CM | POA: Diagnosis not present

## 2021-11-12 DIAGNOSIS — M810 Age-related osteoporosis without current pathological fracture: Secondary | ICD-10-CM | POA: Diagnosis not present

## 2021-11-12 DIAGNOSIS — N952 Postmenopausal atrophic vaginitis: Secondary | ICD-10-CM | POA: Diagnosis not present

## 2021-11-12 DIAGNOSIS — Z01419 Encounter for gynecological examination (general) (routine) without abnormal findings: Secondary | ICD-10-CM | POA: Diagnosis not present

## 2021-11-12 DIAGNOSIS — L900 Lichen sclerosus et atrophicus: Secondary | ICD-10-CM | POA: Diagnosis not present

## 2021-11-13 ENCOUNTER — Other Ambulatory Visit: Payer: Self-pay | Admitting: Obstetrics and Gynecology

## 2021-11-13 DIAGNOSIS — M810 Age-related osteoporosis without current pathological fracture: Secondary | ICD-10-CM

## 2021-12-05 DIAGNOSIS — L259 Unspecified contact dermatitis, unspecified cause: Secondary | ICD-10-CM | POA: Diagnosis not present

## 2021-12-05 DIAGNOSIS — Z6822 Body mass index (BMI) 22.0-22.9, adult: Secondary | ICD-10-CM | POA: Diagnosis not present

## 2022-02-10 ENCOUNTER — Other Ambulatory Visit: Payer: Self-pay | Admitting: Obstetrics and Gynecology

## 2022-02-10 DIAGNOSIS — Z1231 Encounter for screening mammogram for malignant neoplasm of breast: Secondary | ICD-10-CM

## 2022-02-10 DIAGNOSIS — B079 Viral wart, unspecified: Secondary | ICD-10-CM | POA: Diagnosis not present

## 2022-02-10 DIAGNOSIS — R58 Hemorrhage, not elsewhere classified: Secondary | ICD-10-CM | POA: Diagnosis not present

## 2022-02-10 DIAGNOSIS — L821 Other seborrheic keratosis: Secondary | ICD-10-CM | POA: Diagnosis not present

## 2022-02-10 DIAGNOSIS — D485 Neoplasm of uncertain behavior of skin: Secondary | ICD-10-CM | POA: Diagnosis not present

## 2022-02-13 DIAGNOSIS — Z1331 Encounter for screening for depression: Secondary | ICD-10-CM | POA: Diagnosis not present

## 2022-02-13 DIAGNOSIS — Z6822 Body mass index (BMI) 22.0-22.9, adult: Secondary | ICD-10-CM | POA: Diagnosis not present

## 2022-02-13 DIAGNOSIS — I951 Orthostatic hypotension: Secondary | ICD-10-CM | POA: Diagnosis not present

## 2022-02-13 DIAGNOSIS — M1990 Unspecified osteoarthritis, unspecified site: Secondary | ICD-10-CM | POA: Diagnosis not present

## 2022-02-17 DIAGNOSIS — E875 Hyperkalemia: Secondary | ICD-10-CM | POA: Diagnosis not present

## 2022-05-04 DIAGNOSIS — Z6822 Body mass index (BMI) 22.0-22.9, adult: Secondary | ICD-10-CM | POA: Diagnosis not present

## 2022-05-04 DIAGNOSIS — I951 Orthostatic hypotension: Secondary | ICD-10-CM | POA: Diagnosis not present

## 2022-05-04 DIAGNOSIS — M1990 Unspecified osteoarthritis, unspecified site: Secondary | ICD-10-CM | POA: Diagnosis not present

## 2022-05-04 DIAGNOSIS — L821 Other seborrheic keratosis: Secondary | ICD-10-CM | POA: Diagnosis not present

## 2022-05-04 DIAGNOSIS — M810 Age-related osteoporosis without current pathological fracture: Secondary | ICD-10-CM | POA: Diagnosis not present

## 2022-05-04 DIAGNOSIS — D225 Melanocytic nevi of trunk: Secondary | ICD-10-CM | POA: Diagnosis not present

## 2022-05-04 DIAGNOSIS — L814 Other melanin hyperpigmentation: Secondary | ICD-10-CM | POA: Diagnosis not present

## 2022-05-04 DIAGNOSIS — L900 Lichen sclerosus et atrophicus: Secondary | ICD-10-CM | POA: Diagnosis not present

## 2022-05-27 DIAGNOSIS — H26493 Other secondary cataract, bilateral: Secondary | ICD-10-CM | POA: Diagnosis not present

## 2022-05-27 DIAGNOSIS — H524 Presbyopia: Secondary | ICD-10-CM | POA: Diagnosis not present

## 2022-06-15 ENCOUNTER — Ambulatory Visit
Admission: RE | Admit: 2022-06-15 | Discharge: 2022-06-15 | Disposition: A | Discharge status: Home / Self Care | Payer: Medicare Other | Source: Ambulatory Visit | Attending: Obstetrics and Gynecology | Admitting: Obstetrics and Gynecology

## 2022-06-15 DIAGNOSIS — Z1231 Encounter for screening mammogram for malignant neoplasm of breast: Secondary | ICD-10-CM | POA: Diagnosis not present

## 2022-06-17 ENCOUNTER — Other Ambulatory Visit: Payer: Self-pay | Admitting: Obstetrics and Gynecology

## 2022-06-17 DIAGNOSIS — R928 Other abnormal and inconclusive findings on diagnostic imaging of breast: Secondary | ICD-10-CM

## 2022-06-23 DIAGNOSIS — M25562 Pain in left knee: Secondary | ICD-10-CM | POA: Diagnosis not present

## 2022-06-23 DIAGNOSIS — M25462 Effusion, left knee: Secondary | ICD-10-CM | POA: Diagnosis not present

## 2022-06-29 DIAGNOSIS — M25562 Pain in left knee: Secondary | ICD-10-CM | POA: Diagnosis not present

## 2022-07-01 DIAGNOSIS — M25562 Pain in left knee: Secondary | ICD-10-CM | POA: Diagnosis not present

## 2022-07-01 DIAGNOSIS — M1712 Unilateral primary osteoarthritis, left knee: Secondary | ICD-10-CM | POA: Diagnosis not present

## 2022-07-01 DIAGNOSIS — M25462 Effusion, left knee: Secondary | ICD-10-CM | POA: Diagnosis not present

## 2022-07-07 ENCOUNTER — Ambulatory Visit
Admission: RE | Admit: 2022-07-07 | Discharge: 2022-07-07 | Disposition: A | Discharge status: Home / Self Care | Payer: Medicare Other | Source: Ambulatory Visit | Attending: Obstetrics and Gynecology | Admitting: Obstetrics and Gynecology

## 2022-07-07 DIAGNOSIS — R92322 Mammographic fibroglandular density, left breast: Secondary | ICD-10-CM | POA: Diagnosis not present

## 2022-07-07 DIAGNOSIS — N6324 Unspecified lump in the left breast, lower inner quadrant: Secondary | ICD-10-CM | POA: Diagnosis not present

## 2022-07-07 DIAGNOSIS — R928 Other abnormal and inconclusive findings on diagnostic imaging of breast: Secondary | ICD-10-CM

## 2022-07-07 DIAGNOSIS — N6322 Unspecified lump in the left breast, upper inner quadrant: Secondary | ICD-10-CM | POA: Diagnosis not present

## 2022-07-08 DIAGNOSIS — H26492 Other secondary cataract, left eye: Secondary | ICD-10-CM | POA: Diagnosis not present

## 2022-07-16 ENCOUNTER — Other Ambulatory Visit: Payer: Self-pay | Admitting: Obstetrics and Gynecology

## 2022-07-16 DIAGNOSIS — M810 Age-related osteoporosis without current pathological fracture: Secondary | ICD-10-CM

## 2022-07-21 ENCOUNTER — Ambulatory Visit: Payer: Medicare HMO

## 2022-07-21 ENCOUNTER — Other Ambulatory Visit: Payer: Medicare HMO

## 2022-08-14 DIAGNOSIS — L659 Nonscarring hair loss, unspecified: Secondary | ICD-10-CM | POA: Diagnosis not present

## 2022-08-14 DIAGNOSIS — R5383 Other fatigue: Secondary | ICD-10-CM | POA: Diagnosis not present

## 2022-08-14 DIAGNOSIS — Z6822 Body mass index (BMI) 22.0-22.9, adult: Secondary | ICD-10-CM | POA: Diagnosis not present

## 2022-08-24 DIAGNOSIS — Z6822 Body mass index (BMI) 22.0-22.9, adult: Secondary | ICD-10-CM | POA: Diagnosis not present

## 2022-08-24 DIAGNOSIS — N390 Urinary tract infection, site not specified: Secondary | ICD-10-CM | POA: Diagnosis not present

## 2022-08-24 DIAGNOSIS — M1990 Unspecified osteoarthritis, unspecified site: Secondary | ICD-10-CM | POA: Diagnosis not present

## 2022-10-27 DIAGNOSIS — N952 Postmenopausal atrophic vaginitis: Secondary | ICD-10-CM | POA: Diagnosis not present

## 2022-10-27 DIAGNOSIS — L900 Lichen sclerosus et atrophicus: Secondary | ICD-10-CM | POA: Diagnosis not present

## 2022-11-11 ENCOUNTER — Encounter: Payer: Self-pay | Admitting: Gastroenterology

## 2022-11-25 ENCOUNTER — Ambulatory Visit
Admission: RE | Admit: 2022-11-25 | Discharge: 2022-11-25 | Disposition: A | Discharge status: Home / Self Care | Payer: Medicare HMO | Source: Ambulatory Visit | Attending: Obstetrics and Gynecology | Admitting: Obstetrics and Gynecology

## 2022-11-25 DIAGNOSIS — M810 Age-related osteoporosis without current pathological fracture: Secondary | ICD-10-CM

## 2022-11-25 DIAGNOSIS — M8589 Other specified disorders of bone density and structure, multiple sites: Secondary | ICD-10-CM | POA: Diagnosis not present

## 2022-11-25 DIAGNOSIS — Z780 Asymptomatic menopausal state: Secondary | ICD-10-CM | POA: Diagnosis not present

## 2022-12-23 DIAGNOSIS — Z1231 Encounter for screening mammogram for malignant neoplasm of breast: Secondary | ICD-10-CM | POA: Diagnosis not present

## 2022-12-23 DIAGNOSIS — M810 Age-related osteoporosis without current pathological fracture: Secondary | ICD-10-CM | POA: Diagnosis not present

## 2022-12-23 DIAGNOSIS — N952 Postmenopausal atrophic vaginitis: Secondary | ICD-10-CM | POA: Diagnosis not present

## 2022-12-23 DIAGNOSIS — Z01419 Encounter for gynecological examination (general) (routine) without abnormal findings: Secondary | ICD-10-CM | POA: Diagnosis not present

## 2022-12-23 DIAGNOSIS — Z6823 Body mass index (BMI) 23.0-23.9, adult: Secondary | ICD-10-CM | POA: Diagnosis not present

## 2022-12-23 DIAGNOSIS — L900 Lichen sclerosus et atrophicus: Secondary | ICD-10-CM | POA: Diagnosis not present

## 2022-12-23 DIAGNOSIS — M8580 Other specified disorders of bone density and structure, unspecified site: Secondary | ICD-10-CM | POA: Diagnosis not present

## 2022-12-23 DIAGNOSIS — Z1211 Encounter for screening for malignant neoplasm of colon: Secondary | ICD-10-CM | POA: Diagnosis not present

## 2023-01-06 ENCOUNTER — Other Ambulatory Visit: Payer: Medicare Other

## 2023-01-15 DIAGNOSIS — N952 Postmenopausal atrophic vaginitis: Secondary | ICD-10-CM | POA: Diagnosis not present

## 2023-01-15 DIAGNOSIS — L900 Lichen sclerosus et atrophicus: Secondary | ICD-10-CM | POA: Diagnosis not present

## 2023-01-18 DIAGNOSIS — M9901 Segmental and somatic dysfunction of cervical region: Secondary | ICD-10-CM | POA: Diagnosis not present

## 2023-01-18 DIAGNOSIS — M9904 Segmental and somatic dysfunction of sacral region: Secondary | ICD-10-CM | POA: Diagnosis not present

## 2023-01-18 DIAGNOSIS — M9903 Segmental and somatic dysfunction of lumbar region: Secondary | ICD-10-CM | POA: Diagnosis not present

## 2023-01-18 DIAGNOSIS — M9902 Segmental and somatic dysfunction of thoracic region: Secondary | ICD-10-CM | POA: Diagnosis not present

## 2023-01-18 DIAGNOSIS — M5136 Other intervertebral disc degeneration, lumbar region: Secondary | ICD-10-CM | POA: Diagnosis not present

## 2023-01-20 DIAGNOSIS — M545 Low back pain, unspecified: Secondary | ICD-10-CM | POA: Diagnosis not present

## 2023-01-20 HISTORY — DX: Low back pain, unspecified: M54.50

## 2023-02-15 DIAGNOSIS — M5451 Vertebrogenic low back pain: Secondary | ICD-10-CM | POA: Diagnosis not present

## 2023-02-16 ENCOUNTER — Encounter: Payer: Self-pay | Admitting: Gastroenterology

## 2023-02-16 ENCOUNTER — Ambulatory Visit: Payer: Medicare HMO | Admitting: Gastroenterology

## 2023-02-16 VITALS — BP 122/70 | HR 73 | Ht 64.0 in | Wt 136.2 lb

## 2023-02-16 DIAGNOSIS — Z1211 Encounter for screening for malignant neoplasm of colon: Secondary | ICD-10-CM | POA: Diagnosis not present

## 2023-02-16 DIAGNOSIS — Z1212 Encounter for screening for malignant neoplasm of rectum: Secondary | ICD-10-CM

## 2023-02-16 NOTE — Progress Notes (Signed)
Chief Complaint: For CRC screening  Referring Provider:  Noni Saupe, MD      ASSESSMENT AND PLAN;   #1. CRC screening  Plan: -Cologuard. -If +, colon.   Discussed risks and benefits   HPI:    Joann Curtis is a 72 y.o. female  Here for CRC screening  Not keen on getting repeat colonoscopy.  Would like to get Cologuard.  She does understand that if Cologuard is positive, she would require colonoscopy.  No nausea, vomiting, heartburn, regurgitation, odynophagia or dysphagia.  No significant diarrhea or constipation (only when she doesn't drink enough water).  No melena or hematochezia. No unintentional weight loss. No abdominal pain.  Previous GI workup: Colonoscopy 12/2012 ([facility]) -Mild pancolonic diverticulosis -Small internal hemorrhoids -Otherwise normal to TI. -Repeat 10 years  Previous colonoscopies in [place]-stopped due to hypotension  SH-still works at dental office   Past Medical History:  Diagnosis Date   Osteopenia    Vaginal atrophy     Past Surgical History:  Procedure Laterality Date   COLONOSCOPY  12/07/2012   Mild pancolic diverticulosis. Small internal hemorrhoids. Otherwise normal colonoscopy to terminal ileum   TUBAL LIGATION     WISDOM TOOTH EXTRACTION      Family History  Problem Relation Age of Onset   Leukemia Sister    Cancer Paternal Uncle        colon   Colon cancer Neg Hx    Esophageal cancer Neg Hx    Stomach cancer Neg Hx    Colon polyps Neg Hx     Social History   Tobacco Use   Smoking status: Never   Smokeless tobacco: Never  Vaping Use   Vaping status: Never Used  Substance Use Topics   Alcohol use: Yes    Comment: social   Drug use: No    Current Outpatient Medications  Medication Sig Dispense Refill   CALCIUM PO Take by mouth.     Estradiol (VAGIFEM) 10 MCG TABS Place 1 tablet (10 mcg total) vaginally 2 (two) times a week. 24 tablet 4   fish oil-omega-3 fatty acids 1000 MG capsule Take 2 g by  mouth daily.     Multiple Vitamins-Minerals (MULTIVITAMIN WITH MINERALS) tablet Take 1 tablet by mouth daily.     Probiotic Product (PROBIOTIC DAILY PO) Take by mouth.     No current facility-administered medications for this visit.    Allergies  Allergen Reactions   Penicillins     Review of Systems:  Constitutional: Denies fever, chills, diaphoresis, appetite change and fatigue.  HEENT: Denies photophobia, eye pain, redness, hearing loss, ear pain, congestion, sore throat, rhinorrhea, sneezing, mouth sores, neck pain, neck stiffness and tinnitus.   Respiratory: Denies SOB, DOE, cough, chest tightness,  and wheezing.   Cardiovascular: Denies chest pain, palpitations and leg swelling.  Genitourinary: Denies dysuria, urgency, frequency, hematuria, flank pain and difficulty urinating.  Musculoskeletal: Denies myalgias, has back pain, joint swelling, arthralgias and gait problem.  Skin: No rash.  Neurological: Denies dizziness, seizures, syncope, weakness, light-headedness, numbness and headaches.  Hematological: Denies adenopathy. Easy bruising, personal or family bleeding history  Psychiatric/Behavioral: No anxiety or depression     Physical Exam:    BP 122/70   Pulse 73   Ht 5\' 4"  (1.626 m)   Wt 136 lb 3.2 oz (61.8 kg)   SpO2 97%   BMI 23.38 kg/m  Wt Readings from Last 3 Encounters:  02/16/23 136 lb 3.2 oz (61.8 kg)  05/25/12 133 lb (60.3 kg)   Constitutional:  Well-developed, in no acute distress. Psychiatric: Normal mood and affect. Behavior is normal. HEENT: Pupils normal.  Conjunctivae are normal. No scleral icterus. Neck supple.  Cardiovascular: Normal rate, regular rhythm. No edema Pulmonary/chest: Effort normal and breath sounds normal. No wheezing, rales or rhonchi. Abdominal: Soft, nondistended. Nontender. Bowel sounds active throughout. There are no masses palpable. No hepatomegaly. Rectal: Deferred Neurological: Alert and oriented to person place and  time. Skin: Skin is warm and dry. No rashes noted.    Edman Circle, MD 02/16/2023, 9:51 AM  Cc: Noni Saupe, MD

## 2023-02-16 NOTE — Patient Instructions (Addendum)
_______________________________________________________  If your blood pressure at your visit was 140/90 or greater, please contact your primary care physician to follow up on this.  _______________________________________________________  If you are age 74 or older, your body mass index should be between 23-30. Your Body mass index is 23.38 kg/m. If this is out of the aforementioned range listed, please consider follow up with your Primary Care Provider.  If you are age 2 or younger, your body mass index should be between 19-25. Your Body mass index is 23.38 kg/m. If this is out of the aformentioned range listed, please consider follow up with your Primary Care Provider.   ________________________________________________________  The [facility] providers would like to encourage you to use [facility] to communicate with providers for non-urgent requests or questions.  Due to long hold times on the telephone, sending your provider a message by [facility] may be a faster and more efficient way to get a response.  Please allow 48 business hours for a response.  Please remember that this is for non-urgent requests.  _______________________________________________________  Your provider has ordered Cologuard testing as an option for colon cancer screening. This is performed by [employer] and may be out of network with your insurance. PRIOR to completing the test, it is YOUR responsibility to contact your insurance about covered benefits for this test. Your out of pocket expense could be anywhere from $0.00 to $649.00.   When you call to check coverage with your insurer, please provide the following information:   -The ONLY provider of Cologuard is [facility], dispensing  - CPT code for Cologuard is [phone].  [employer] NPI # [id number]  -[employer] Tax ID # [id number]   We have already sent your demographic and insurance information to [employer] (phone  number [phone]) and they should contact you within the next week regarding your test. If you have not heard from them within the next week, please call our office at [phone].  Thank you,  Dr. Lynann Bologna

## 2023-02-25 DIAGNOSIS — R5383 Other fatigue: Secondary | ICD-10-CM | POA: Diagnosis not present

## 2023-02-25 DIAGNOSIS — I951 Orthostatic hypotension: Secondary | ICD-10-CM | POA: Diagnosis not present

## 2023-02-25 DIAGNOSIS — E559 Vitamin D deficiency, unspecified: Secondary | ICD-10-CM | POA: Diagnosis not present

## 2023-03-01 DIAGNOSIS — Z1212 Encounter for screening for malignant neoplasm of rectum: Secondary | ICD-10-CM | POA: Diagnosis not present

## 2023-03-01 DIAGNOSIS — Z1211 Encounter for screening for malignant neoplasm of colon: Secondary | ICD-10-CM | POA: Diagnosis not present

## 2023-05-12 DIAGNOSIS — Z008 Encounter for other general examination: Secondary | ICD-10-CM | POA: Diagnosis not present

## 2023-05-24 ENCOUNTER — Other Ambulatory Visit: Payer: Self-pay | Admitting: Obstetrics and Gynecology

## 2023-05-24 DIAGNOSIS — Z1231 Encounter for screening mammogram for malignant neoplasm of breast: Secondary | ICD-10-CM

## 2023-05-25 ENCOUNTER — Telehealth: Payer: Self-pay

## 2023-05-25 NOTE — Telephone Encounter (Signed)
Per [facility] physician patient was never seen at any of their practices. I called LM to return my call for PCP info.

## 2023-05-26 ENCOUNTER — Encounter: Payer: Self-pay | Admitting: Cardiology

## 2023-05-27 NOTE — Telephone Encounter (Signed)
Patient came in today to sign another release if necessary, I explained to patient that I had sent an ROI to [facility] and that they had no records to send. Patient stated she would call the office and tell them to send them because she was a patient. Patient also said Carotid US was done at [facility] in late 2018 but upon opening her chart in [facility]) there was no record of her carotid US.

## 2023-05-28 DIAGNOSIS — N952 Postmenopausal atrophic vaginitis: Secondary | ICD-10-CM | POA: Diagnosis not present

## 2023-05-28 DIAGNOSIS — L900 Lichen sclerosus et atrophicus: Secondary | ICD-10-CM | POA: Diagnosis not present

## 2023-06-02 ENCOUNTER — Ambulatory Visit: Payer: Medicare HMO | Attending: Cardiology | Admitting: Cardiology

## 2023-06-02 VITALS — BP 128/86 | HR 66 | Ht 64.5 in | Wt 139.2 lb

## 2023-06-02 DIAGNOSIS — Z8249 Family history of ischemic heart disease and other diseases of the circulatory system: Secondary | ICD-10-CM | POA: Diagnosis not present

## 2023-06-02 DIAGNOSIS — R0989 Other specified symptoms and signs involving the circulatory and respiratory systems: Secondary | ICD-10-CM

## 2023-06-02 DIAGNOSIS — R5382 Chronic fatigue, unspecified: Secondary | ICD-10-CM | POA: Diagnosis not present

## 2023-06-02 DIAGNOSIS — R0609 Other forms of dyspnea: Secondary | ICD-10-CM

## 2023-06-02 DIAGNOSIS — R9430 Abnormal result of cardiovascular function study, unspecified: Secondary | ICD-10-CM | POA: Diagnosis not present

## 2023-06-02 DIAGNOSIS — R5383 Other fatigue: Secondary | ICD-10-CM

## 2023-06-02 HISTORY — DX: Other specified symptoms and signs involving the circulatory and respiratory systems: R09.89

## 2023-06-02 HISTORY — DX: Other fatigue: R53.83

## 2023-06-02 HISTORY — DX: Other forms of dyspnea: R06.09

## 2023-06-02 NOTE — Patient Instructions (Signed)
Medication Instructions:  Your physician recommends that you continue on your current medications as directed. Please refer to the Current Medication list given to you today.  *If you need a refill on your cardiac medications before your next appointment, please call your pharmacy*   Lab Work: None Ordered If you have labs (blood work) drawn today and your tests are completely normal, you will receive your results only by: MyChart Message (if you have MyChart) OR A paper copy in the mail If you have any lab test that is abnormal or we need to change your treatment, we will call you to review the results.   Testing/Procedures: Your physician has requested that you have an echocardiogram. Echocardiography is a painless test that uses sound waves to create images of your heart. It provides your doctor with information about the size and shape of your heart and how well your heart's chambers and valves are working. This procedure takes approximately one hour. There are no restrictions for this procedure. Please do NOT wear cologne, perfume, aftershave, or lotions (deodorant is allowed). Please arrive 15 minutes prior to your appointment time.   We will order CT coronary calcium score. It will cost $99.00 and is not covered by insurance.  Please call to schedule.    [facility] [address] [place], [place] [address] [phone]    Your physician has requested that you have a carotid duplex. This test is an ultrasound of the carotid arteries in your neck. It looks at blood flow through these arteries that supply the brain with blood. Allow one hour for this exam. There are no restrictions or special instructions.    Follow-Up: At [facility], you and your health needs are our priority.  As part of our continuing mission to provide you with exceptional heart care, we have created designated Provider Care Teams.  These Care Teams include your primary Cardiologist  (physician) and Advanced Practice Providers (APPs -  Physician Assistants and Nurse Practitioners) who all work together to provide you with the care you need, when you need it.  We recommend signing up for the patient portal called "MyChart".  Sign up information is provided on this After Visit Summary.  MyChart is used to connect with patients for Virtual Visits (Telemedicine).  Patients are able to view lab/test results, encounter notes, upcoming appointments, etc.  Non-urgent messages can be sent to your provider as well.   To learn more about what you can do with MyChart, go to [email or web address].    Your next appointment:   2 month(s)  The format for your next appointment:   In Person  Provider:   Gypsy Balsam, MD    Other Instructions NA

## 2023-06-02 NOTE — Progress Notes (Unsigned)
Cardiology Consultation:    Date:  06/02/2023   ID:  Joann Curtis, DOB 11-22-1950, MRN [id number]  PCP:  Patient, No Pcp Per  Cardiologist:  Gypsy Balsam, MD   Referring MD: Noni Saupe, MD   Chief Complaint  Patient presents with   Follow-up    History of Present Illness:    Joann Curtis is a 72 y.o. female who is being seen today for the evaluation of left carotic bruit at the request of Noni Saupe, MD. with no significant past medical history.  She was examined by her primary care physician he heard a bruit on the right side and she was referred to [place].  Overall she is very active she goes to gym on the regular basis but she noticed within the last few months her ability to exercise has decreased quite significantly there is no chest pain tightness squeezing pressure burning chest or shortness of breath and fatigue.  She denies having swelling of lower extremities.  Overall it looks like she is doing quite well.  Past Medical History:  Diagnosis Date   Osteopenia    Vaginal atrophy     Past Surgical History:  Procedure Laterality Date   COLONOSCOPY  12/07/2012   Mild pancolic diverticulosis. Small internal hemorrhoids. Otherwise normal colonoscopy to terminal ileum   TUBAL LIGATION     WISDOM TOOTH EXTRACTION      Current Medications: Current Meds  Medication Sig   CALCIUM PO Take 1 tablet by mouth daily.   clobetasol ointment (TEMOVATE) 0.05 % Apply 1 Application topically See admin instructions. 5 times weekly   Estradiol (VAGIFEM) 10 MCG TABS Place 1 tablet (10 mcg total) vaginally 2 (two) times a week.   fish oil-omega-3 fatty acids 1000 MG capsule Take 2 g by mouth daily.   Multiple Vitamins-Minerals (MULTIVITAMIN WITH MINERALS) tablet Take 1 tablet by mouth daily.   Probiotic Product (PROBIOTIC DAILY PO) Take 1 tablet by mouth daily.     Allergies:   Penicillins   Social History   Socioeconomic History   Marital status:  Married    Spouse name: Not on file   Number of children: 3   Years of education: Not on file   Highest education level: Not on file  Occupational History   Occupation: Administrative  Tobacco Use   Smoking status: Never   Smokeless tobacco: Never  Vaping Use   Vaping status: Never Used  Substance and Sexual Activity   Alcohol use: Yes    Comment: social   Drug use: No   Sexual activity: Yes    Birth control/protection: Post-menopausal  Other Topics Concern   Not on file  Social History Narrative   Not on file   Social Determinants of Health   Financial Resource Strain: Not on file  Food Insecurity: Not on file  Transportation Needs: Not on file  Physical Activity: Not on file  Stress: Not on file  Social Connections: Not on file     Family History: The patient's family history includes Cancer in her paternal uncle; Leukemia in her sister. There is no history of Colon cancer, Esophageal cancer, Stomach cancer, or Colon polyps. ROS:   Please see the history of present illness.    All 14 point review of systems negative except as described per history of present illness.  EKGs/Labs/Other Studies Reviewed:    The following studies were reviewed today:   EKG:       Recent Labs:  No results found for requested labs within last 365 days.  Recent Lipid Panel No results found for: "CHOL", "TRIG", "HDL", "CHOLHDL", "VLDL", "LDLCALC", "LDLDIRECT"  Physical Exam:    VS:  BP 128/86 (BP Location: Left Arm, Patient Position: Sitting)   Pulse 66   Ht 5' 4.5" (1.638 m)   Wt 139 lb 3.2 oz (63.1 kg)   SpO2 97%   BMI 23.52 kg/m     Wt Readings from Last 3 Encounters:  06/02/23 139 lb 3.2 oz (63.1 kg)  02/16/23 136 lb 3.2 oz (61.8 kg)  05/25/12 133 lb (60.3 kg)     GEN:  Well nourished, well developed in no acute distress HEENT: Normal NECK: No JVD; No carotid bruits LYMPHATICS: No lymphadenopathy CARDIAC: RRR, no murmurs, no rubs, no gallops RESPIRATORY:  Clear to  auscultation without rales, wheezing or rhonchi  ABDOMEN: Soft, non-tender, non-distended MUSCULOSKELETAL:  No edema; No deformity  SKIN: Warm and dry NEUROLOGIC:  Alert and oriented x 3 PSYCHIATRIC:  Normal affect   ASSESSMENT:    1. Nonspecific abnormal results of function study of cardiovascular system   2. Left carotid bruit   3. Dyspnea on exertion   4. Chronic fatigue    PLAN:    In order of problems listed above:  Left carotic bruit.  I will schedule passive carotic ultrasounds only seen by physical exam I do not hear a bruit I hear her heart beating in the artery but no bruit.  Will do carotic ultrasounds for proper assessment. Dyspnea exertion with fatigue tiredness.  Have scheduled to have echocardiogram to assess left ventricle ejection fraction.  She described the fact that she was doing quite well he quite abruptly became fatigued and tired.  I will schedule her to have coronary calcium score make sure she does not have any calcification of the coronary arteries.  If she does not we can simply continue monitoring. Chronic fatigue plan as described above. I did review K PN which show me her total cholesterol 256 HDL 100.  Will call primary care physician to get full cholesterol profile for   Medication Adjustments/Labs and Tests Ordered: Current medicines are reviewed at length with the patient today.  Concerns regarding medicines are outlined above.  Orders Placed This Encounter  Procedures   EKG 12-Lead   No orders of the defined types were placed in this encounter.   Signed, Georgeanna Lea, MD, [facility]. 06/02/2023 4:07 PM    [facility]

## 2023-06-09 ENCOUNTER — Ambulatory Visit (HOSPITAL_BASED_OUTPATIENT_CLINIC_OR_DEPARTMENT_OTHER)
Admission: RE | Admit: 2023-06-09 | Discharge: 2023-06-09 | Disposition: A | Discharge status: Home / Self Care | Payer: Medicare HMO | Source: Ambulatory Visit | Attending: Cardiology | Admitting: Cardiology

## 2023-06-09 ENCOUNTER — Encounter (HOSPITAL_BASED_OUTPATIENT_CLINIC_OR_DEPARTMENT_OTHER): Payer: Self-pay

## 2023-06-09 DIAGNOSIS — Z8249 Family history of ischemic heart disease and other diseases of the circulatory system: Secondary | ICD-10-CM | POA: Insufficient documentation

## 2023-06-18 ENCOUNTER — Telehealth: Payer: Self-pay

## 2023-06-18 NOTE — Telephone Encounter (Signed)
Pt viewed results on My Chart per Dr. Krasowski's note. Routed to PCP.  

## 2023-06-18 NOTE — Telephone Encounter (Signed)
Left message on My Chart with normal calcium score results per Dr. Vanetta Shawl note. Routed to PCP.

## 2023-06-24 ENCOUNTER — Ambulatory Visit: Payer: Medicare HMO

## 2023-06-24 ENCOUNTER — Ambulatory Visit: Payer: Medicare HMO | Attending: Cardiology

## 2023-06-24 DIAGNOSIS — R0609 Other forms of dyspnea: Secondary | ICD-10-CM

## 2023-06-24 DIAGNOSIS — R0989 Other specified symptoms and signs involving the circulatory and respiratory systems: Secondary | ICD-10-CM | POA: Diagnosis not present

## 2023-06-24 LAB — ECHOCARDIOGRAM COMPLETE: S' Lateral: 2.9 cm

## 2023-06-25 ENCOUNTER — Telehealth: Payer: Self-pay

## 2023-06-25 NOTE — Telephone Encounter (Signed)
-----   Message from Gypsy Balsam sent at 06/25/2023 11:34 AM EST ----- Echocardiogram showed preserved left ventricle ejection fraction, mild mitral valve regurgitation, overall looks good

## 2023-06-25 NOTE — Telephone Encounter (Signed)
Patient notified through my chart.

## 2023-06-29 ENCOUNTER — Telehealth: Payer: Self-pay

## 2023-06-29 NOTE — Telephone Encounter (Signed)
[place] Results reviewed with pt as per Dr. Vanetta Shawl note.  Pt verbalized understanding and had no additional questions. Routed to PCP

## 2023-07-07 DIAGNOSIS — L814 Other melanin hyperpigmentation: Secondary | ICD-10-CM | POA: Diagnosis not present

## 2023-07-07 DIAGNOSIS — D225 Melanocytic nevi of trunk: Secondary | ICD-10-CM | POA: Diagnosis not present

## 2023-07-07 DIAGNOSIS — L821 Other seborrheic keratosis: Secondary | ICD-10-CM | POA: Diagnosis not present

## 2023-07-07 DIAGNOSIS — L900 Lichen sclerosus et atrophicus: Secondary | ICD-10-CM | POA: Diagnosis not present

## 2023-07-07 DIAGNOSIS — L219 Seborrheic dermatitis, unspecified: Secondary | ICD-10-CM | POA: Diagnosis not present

## 2023-07-07 DIAGNOSIS — M79671 Pain in right foot: Secondary | ICD-10-CM | POA: Diagnosis not present

## 2023-07-07 DIAGNOSIS — L578 Other skin changes due to chronic exposure to nonionizing radiation: Secondary | ICD-10-CM | POA: Diagnosis not present

## 2023-07-09 ENCOUNTER — Ambulatory Visit
Admission: RE | Admit: 2023-07-09 | Discharge: 2023-07-09 | Disposition: A | Discharge status: Home / Self Care | Payer: Medicare HMO | Source: Ambulatory Visit | Attending: Obstetrics and Gynecology | Admitting: Obstetrics and Gynecology

## 2023-07-09 DIAGNOSIS — Z1231 Encounter for screening mammogram for malignant neoplasm of breast: Secondary | ICD-10-CM

## 2023-07-15 DIAGNOSIS — R69 Illness, unspecified: Secondary | ICD-10-CM | POA: Diagnosis not present

## 2023-08-06 ENCOUNTER — Encounter: Payer: Self-pay | Admitting: Cardiology

## 2023-08-10 ENCOUNTER — Ambulatory Visit: Payer: Medicare HMO | Attending: Cardiology | Admitting: Cardiology

## 2023-08-10 ENCOUNTER — Encounter: Payer: Self-pay | Admitting: Cardiology

## 2023-08-10 VITALS — BP 118/84 | HR 73 | Ht 64.0 in | Wt 131.4 lb

## 2023-08-10 DIAGNOSIS — R0609 Other forms of dyspnea: Secondary | ICD-10-CM | POA: Diagnosis not present

## 2023-08-10 DIAGNOSIS — R0989 Other specified symptoms and signs involving the circulatory and respiratory systems: Secondary | ICD-10-CM

## 2023-08-10 DIAGNOSIS — I739 Peripheral vascular disease, unspecified: Secondary | ICD-10-CM | POA: Insufficient documentation

## 2023-08-10 MED ORDER — ATORVASTATIN CALCIUM 10 MG PO TABS: 10.0000 mg | ORAL_TABLET | Freq: Every day | ORAL | 3 refills | Status: DC

## 2023-08-10 MED ORDER — ASPIRIN 81 MG PO TBEC: 81.0000 mg | DELAYED_RELEASE_TABLET | Freq: Every day | ORAL | Status: AC

## 2023-08-10 NOTE — Patient Instructions (Signed)
 Medication Instructions:   START: Aspirin  81mg  1 tablet daily  START: Lipitor 10mg  1 tablet daily   Lab Work: Your physician recommends that you return for lab work in: 6 weeks You need to have labs done when you are fasting.  You can come Monday through Friday 8:30 am to 12:00 pm and 1:15 to 4:30. You do not need to make an appointment as the order has already been placed. The labs you are going to have done are Lpa,AST, ALT and Lipids.    Testing/Procedures: 1 week before 6 month follow up Your physician has requested that you have a carotid duplex. This test is an ultrasound of the carotid arteries in your neck. It looks at blood flow through these arteries that supply the brain with blood. Allow one hour for this exam. There are no restrictions or special instructions.    Follow-Up: At [facility], you and your health needs are our priority.  As part of our continuing mission to provide you with exceptional heart care, we have created designated Provider Care Teams.  These Care Teams include your primary Cardiologist (physician) and Advanced Practice Providers (APPs -  Physician Assistants and Nurse Practitioners) who all work together to provide you with the care you need, when you need it.  We recommend signing up for the patient portal called MyChart.  Sign up information is provided on this After Visit Summary.  MyChart is used to connect with patients for Virtual Visits (Telemedicine).  Patients are able to view lab/test results, encounter notes, upcoming appointments, etc.  Non-urgent messages can be sent to your provider as well.   To learn more about what you can do with MyChart, go to [email or web address].    Your next appointment:   6 month(s)  The format for your next appointment:   In Person  Provider:   Lamar Fitch, MD    Other Instructions NA

## 2023-08-10 NOTE — Progress Notes (Signed)
 Cardiology Office Note:    Date:  08/10/2023   ID:  Joann Curtis, DOB 15-Jan-1951, MRN [id number]  PCP:  Conley Dene BROCKS, DO  Cardiologist:  Lamar Fitch, MD    Referring MD: Fitch Lamar PARAS, MD   No chief complaint on file.   History of Present Illness:    Joann Curtis is a 73 y.o. female past medical history significant for carotic bruit discovered on physical exam, after that she get carotic ultrasounds which show up to 59% stenosis on the right side up to 39% stenosis on the left, also was complaining of having weakness fatigue and tiredness.  Comes today to my office for follow-up overall she says she is doing better.  She likes cold weather today is very cold and she is very happy.  She is that she is able to do a lipid better.  She does not exercise on the regular basis as she feels guilty about the fact she does not do it she is thinking about start exercising.  Denies have any chest pain tightness squeezing pressure burning chest  Past Medical History:  Diagnosis Date   Dyspnea on exertion 06/02/2023   Fatigue 06/02/2023   Left carotid bruit 06/02/2023   Lichen sclerosus 05/25/2012   Low back pain 01/20/2023   Osteopenia    Vaginal atrophy     Past Surgical History:  Procedure Laterality Date   COLONOSCOPY  12/07/2012   Mild pancolic diverticulosis. Small internal hemorrhoids. Otherwise normal colonoscopy to terminal ileum   TUBAL LIGATION     WISDOM TOOTH EXTRACTION      Current Medications: Current Meds  Medication Sig   CALCIUM  PO Take 1 tablet by mouth daily.   clobetasol ointment (TEMOVATE) 0.05 % Apply 1 Application topically See admin instructions. 5 times weekly   Estradiol  (VAGIFEM ) 10 MCG TABS Place 1 tablet (10 mcg total) vaginally 2 (two) times a week.   fish oil-omega-3 fatty acids 1000 MG capsule Take 2 g by mouth daily.   Multiple Vitamins-Minerals (MULTIVITAMIN WITH MINERALS) tablet Take 1 tablet by mouth daily.   Probiotic  Product (PROBIOTIC DAILY PO) Take 1 tablet by mouth daily.     Allergies:   Penicillins   Social History   Socioeconomic History   Marital status: Married    Spouse name: Not on file   Number of children: 3   Years of education: Not on file   Highest education level: Not on file  Occupational History   Occupation: Administrative  Tobacco Use   Smoking status: Never   Smokeless tobacco: Never  Vaping Use   Vaping status: Never Used  Substance and Sexual Activity   Alcohol use: Yes    Comment: social   Drug use: No   Sexual activity: Yes    Birth control/protection: Post-menopausal  Other Topics Concern   Not on file  Social History Narrative   Not on file   Social Drivers of Health   Financial Resource Strain: Not on file  Food Insecurity: Not on file  Transportation Needs: Not on file  Physical Activity: Not on file  Stress: Not on file  Social Connections: Not on file     Family History: The patient's family history includes Cancer in her paternal uncle; Leukemia in her sister. There is no history of Colon cancer, Esophageal cancer, Stomach cancer, or Colon polyps. ROS:   Please see the history of present illness.    All 14 point review of systems negative except as  described per history of present illness  EKGs/Labs/Other Studies Reviewed:         Recent Labs: No results found for requested labs within last 365 days.  Recent Lipid Panel No results found for: CHOL, TRIG, HDL, CHOLHDL, VLDL, LDLCALC, LDLDIRECT  Physical Exam:    VS:  BP 118/84   Pulse 73   Ht 5' 4 (1.626 m)   Wt 131 lb 6.4 oz (59.6 kg)   SpO2 97%   BMI 22.55 kg/m     Wt Readings from Last 3 Encounters:  08/10/23 131 lb 6.4 oz (59.6 kg)  06/02/23 139 lb 3.2 oz (63.1 kg)  02/16/23 136 lb 3.2 oz (61.8 kg)     GEN:  Well nourished, well developed in no acute distress HEENT: Normal NECK: No JVD; No carotid bruits LYMPHATICS: No lymphadenopathy CARDIAC: RRR, no  murmurs, no rubs, no gallops RESPIRATORY:  Clear to auscultation without rales, wheezing or rhonchi  ABDOMEN: Soft, non-tender, non-distended MUSCULOSKELETAL:  No edema; No deformity  SKIN: Warm and dry LOWER EXTREMITIES: no swelling NEUROLOGIC:  Alert and oriented x 3 PSYCHIATRIC:  Normal affect   ASSESSMENT:    1. 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 Left carotid bruit   3. Dyspnea on exertion    PLAN:    In order of problems listed above:  Peripheral vascular disease up to 59% stenosis in the right internal carotic artery which is surprising discovery.  He will ask her to start taking antiplatelets therapy so she will take aspirin  every single day, I will also put her on cholesterol medication.  Will start Lipitor 10 daily which is moderate intensity statin.  In 6 weeks we will recheck fasting lipid profile as well as AST ALT and LP(a). Dyspnea exertion echocardiogram showed preserved left ventricle ejection fraction. Risks for coronary artery disease: Her calcium  score is 0 she does not have any symptoms suggestive obstructive disease we will continue risk modifications. We did talk about the risk factors modification which include good diet as well as exercises on the regular basis   Medication Adjustments/Labs and Tests Ordered: Current medicines are reviewed at length with the patient today.  Concerns regarding medicines are outlined above.  No orders of the defined types were placed in this encounter.  Medication changes: No orders of the defined types were placed in this encounter.   Signed, Lamar DOROTHA Fitch, MD, [facility] 08/10/2023 9:15 AM    [facility]

## 2023-08-16 DIAGNOSIS — Z961 Presence of intraocular lens: Secondary | ICD-10-CM | POA: Diagnosis not present

## 2023-08-16 DIAGNOSIS — H524 Presbyopia: Secondary | ICD-10-CM | POA: Diagnosis not present

## 2023-08-27 ENCOUNTER — Other Ambulatory Visit: Payer: Self-pay | Admitting: Family Medicine

## 2023-08-27 DIAGNOSIS — M810 Age-related osteoporosis without current pathological fracture: Secondary | ICD-10-CM

## 2023-09-30 LAB — LIPID PANEL
Chol/HDL Ratio: 2.1 ratio (ref 0.0–4.4)
Cholesterol, Total: 185 mg/dL (ref 100–199)
HDL: 90 mg/dL (ref >39)
LDL Chol Calc (NIH): 84 mg/dL (ref 0–99)
Triglycerides: 59 mg/dL (ref 0–149)
VLDL Cholesterol Cal: 11 mg/dL (ref 5–40)

## 2023-09-30 LAB — ALT: ALT: 24 IU/L (ref 0–32)

## 2023-09-30 LAB — LIPOPROTEIN A (LPA): Lipoprotein (a): 347.9 nmol/L — ABNORMAL HIGH (ref <75.0)

## 2023-09-30 LAB — AST: AST: 26 IU/L (ref 0–40)

## 2023-10-04 ENCOUNTER — Telehealth: Payer: Self-pay

## 2023-10-04 DIAGNOSIS — E785 Hyperlipidemia, unspecified: Secondary | ICD-10-CM

## 2023-10-04 MED ORDER — ATORVASTATIN CALCIUM 20 MG PO TABS: 20.0000 mg | ORAL_TABLET | Freq: Every day | ORAL | 3 refills | Status: DC

## 2023-10-04 NOTE — Telephone Encounter (Signed)
Pt viewed results on My Chart per [facility] note. Routed to PCP

## 2023-10-04 NOTE — Telephone Encounter (Signed)
 Pt viewed lab results on My Chart per [facility] note. Routed to PCP.

## 2023-10-27 DIAGNOSIS — L820 Inflamed seborrheic keratosis: Secondary | ICD-10-CM | POA: Diagnosis not present

## 2023-10-27 DIAGNOSIS — L821 Other seborrheic keratosis: Secondary | ICD-10-CM | POA: Diagnosis not present

## 2024-02-01 ENCOUNTER — Ambulatory Visit (HOSPITAL_COMMUNITY)
Admission: RE | Admit: 2024-02-01 | Discharge: 2024-02-01 | Disposition: A | Discharge status: Home / Self Care | Source: Ambulatory Visit | Attending: Cardiology | Admitting: Cardiology

## 2024-02-01 ENCOUNTER — Encounter

## 2024-02-01 DIAGNOSIS — R0989 Other specified symptoms and signs involving the circulatory and respiratory systems: Secondary | ICD-10-CM | POA: Diagnosis not present

## 2024-02-02 DIAGNOSIS — E785 Hyperlipidemia, unspecified: Secondary | ICD-10-CM | POA: Diagnosis not present

## 2024-02-03 ENCOUNTER — Telehealth: Payer: Self-pay

## 2024-02-03 ENCOUNTER — Ambulatory Visit: Payer: Self-pay | Admitting: Cardiology

## 2024-02-03 DIAGNOSIS — E785 Hyperlipidemia, unspecified: Secondary | ICD-10-CM

## 2024-02-03 LAB — HEPATIC FUNCTION PANEL
ALT: 29 IU/L (ref 0–32)
AST: 35 IU/L (ref 0–40)
Albumin: 4.7 g/dL (ref 3.8–4.8)
Alkaline Phosphatase: 86 IU/L (ref 44–121)
Bilirubin Total: 0.5 mg/dL (ref 0.0–1.2)
Bilirubin, Direct: 0.19 mg/dL (ref 0.00–0.40)
Total Protein: 7.1 g/dL (ref 6.0–8.5)

## 2024-02-03 LAB — LIPID PANEL
Chol/HDL Ratio: 1.9 ratio (ref 0.0–4.4)
Cholesterol, Total: 182 mg/dL (ref 100–199)
HDL: 95 mg/dL
LDL Chol Calc (NIH): 77 mg/dL (ref 0–99)
Triglycerides: 52 mg/dL (ref 0–149)
VLDL Cholesterol Cal: 10 mg/dL (ref 5–40)

## 2024-02-03 MED ORDER — ATORVASTATIN CALCIUM 40 MG PO TABS: 40.0000 mg | ORAL_TABLET | Freq: Every day | ORAL | 3 refills | Status: AC

## 2024-02-03 NOTE — Telephone Encounter (Signed)
-----   Message from Delon JAYSON Hoover sent at 02/03/2024  3:53 PM EDT ----- Lipids improved slightly but still need to be better.  Increase Lipitor to 40 mg each day.   Repeat FLP and LFTs 8 weeks.  ----- Message ----- From: Rebecka Memos Lab Results In Sent: 02/03/2024   5:37 AM EDT To: Delon JAYSON Hoover, NP

## 2024-02-03 NOTE — Telephone Encounter (Signed)
 Left message on My Chart with [place] results per Dr. Vanetta Shawl note. Routed to PCP.

## 2024-02-03 NOTE — Telephone Encounter (Signed)
 MyChart message

## 2024-02-08 ENCOUNTER — Encounter: Payer: Self-pay | Admitting: Cardiology

## 2024-02-08 ENCOUNTER — Ambulatory Visit: Attending: Cardiology | Admitting: Cardiology

## 2024-02-08 VITALS — BP 136/70 | HR 60 | Ht 64.0 in | Wt 132.0 lb

## 2024-02-08 DIAGNOSIS — R0609 Other forms of dyspnea: Secondary | ICD-10-CM

## 2024-02-08 DIAGNOSIS — R0989 Other specified symptoms and signs involving the circulatory and respiratory systems: Secondary | ICD-10-CM

## 2024-02-08 DIAGNOSIS — D225 Melanocytic nevi of trunk: Secondary | ICD-10-CM | POA: Insufficient documentation

## 2024-02-08 DIAGNOSIS — I739 Peripheral vascular disease, unspecified: Secondary | ICD-10-CM | POA: Diagnosis not present

## 2024-02-08 DIAGNOSIS — L249 Irritant contact dermatitis, unspecified cause: Secondary | ICD-10-CM | POA: Insufficient documentation

## 2024-02-08 DIAGNOSIS — R5382 Chronic fatigue, unspecified: Secondary | ICD-10-CM

## 2024-02-08 DIAGNOSIS — R58 Hemorrhage, not elsewhere classified: Secondary | ICD-10-CM | POA: Insufficient documentation

## 2024-02-08 DIAGNOSIS — L821 Other seborrheic keratosis: Secondary | ICD-10-CM | POA: Insufficient documentation

## 2024-02-08 NOTE — Patient Instructions (Signed)

## 2024-02-08 NOTE — Progress Notes (Signed)
 Cardiology Office Note:    Date:  02/08/2024   ID:  GREYDIS STLOUIS, DOB 11-11-1950, MRN [id number]  PCP:  Conley Dene BROCKS, DO  Cardiologist:  Lamar Fitch, MD    Referring MD: Conley Dene BROCKS, DO   Chief Complaint  Patient presents with   Follow-up    History of Present Illness:    Joann Curtis is a 73 y.o. female past medical history significant for peripheral vascular disease significant carotic arterial stenosis stable, dyslipidemia on statin, calcium  score 0.  Comes today 2 months for follow-up overall doing well denies have any chest pain tightness squeezing pressure burning chest but does complain of having fatigue tiredness she is joking saying that it may be because of her grandchildren.  Denies having TIA/CVA symptoms not  Past Medical History:  Diagnosis Date   Dyspnea on exertion 06/02/2023   Fatigue 06/02/2023   Left carotid bruit 06/02/2023   Lichen sclerosus 05/25/2012   Low back pain 01/20/2023   Osteopenia    Vaginal atrophy     Past Surgical History:  Procedure Laterality Date   COLONOSCOPY  12/07/2012   Mild pancolic diverticulosis. Small internal hemorrhoids. Otherwise normal colonoscopy to terminal ileum   TUBAL LIGATION     WISDOM TOOTH EXTRACTION      Current Medications: Current Meds  Medication Sig   aspirin  EC 81 MG tablet Take 1 tablet (81 mg total) by mouth daily. Swallow whole.   atorvastatin  (LIPITOR) 40 MG tablet Take 1 tablet (40 mg total) by mouth daily.   CALCIUM  PO Take 1 tablet by mouth daily.   clobetasol ointment (TEMOVATE) 0.05 % Apply 1 Application topically See admin instructions. 5 times weekly   Estradiol  (VAGIFEM ) 10 MCG TABS Place 1 tablet (10 mcg total) vaginally 2 (two) times a week.   fish oil-omega-3 fatty acids 1000 MG capsule Take 2 g by mouth daily.   Multiple Vitamins-Minerals (MULTIVITAMIN WITH MINERALS) tablet Take 1 tablet by mouth daily.   Probiotic Product (PROBIOTIC DAILY PO) Take 1 tablet by  mouth daily.     Allergies:   Levofloxacin and Penicillins   Social History   Socioeconomic History   Marital status: Married    Spouse name: Not on file   Number of children: 3   Years of education: Not on file   Highest education level: Not on file  Occupational History   Occupation: Administrative  Tobacco Use   Smoking status: Never   Smokeless tobacco: Never  Vaping Use   Vaping status: Never Used  Substance and Sexual Activity   Alcohol use: Yes    Comment: social   Drug use: No   Sexual activity: Yes    Birth control/protection: Post-menopausal  Other Topics Concern   Not on file  Social History Narrative   Not on file   Social Drivers of Health   Financial Resource Strain: Not on file  Food Insecurity: Not on file  Transportation Needs: Not on file  Physical Activity: Not on file  Stress: Not on file  Social Connections: Not on file     Family History: The patient's family history includes Cancer in her paternal uncle; Leukemia in her sister. There is no history of Colon cancer, Esophageal cancer, Stomach cancer, or Colon polyps. ROS:   Please see the history of present illness.    All 14 point review of systems negative except as described per history of present illness  EKGs/Labs/Other Studies Reviewed:  Recent Labs: 02/02/2024: ALT 29  Recent Lipid Panel    Component Value Date/Time   CHOL 182 02/02/2024 0831   TRIG 52 02/02/2024 0831   HDL 95 02/02/2024 0831   CHOLHDL 1.9 02/02/2024 0831   LDLCALC 77 02/02/2024 0831    Physical Exam:    VS:  BP 136/70 (BP Location: Right Arm, Patient Position: Sitting)   Pulse 60   Ht 5' 4 (1.626 m)   Wt 132 lb (59.9 kg)   SpO2 98%   BMI 22.66 kg/m     Wt Readings from Last 3 Encounters:  02/08/24 132 lb (59.9 kg)  08/10/23 131 lb 6.4 oz (59.6 kg)  06/02/23 139 lb 3.2 oz (63.1 kg)     GEN:  Well nourished, well developed in no acute distress HEENT: Normal NECK: No JVD; No carotid  bruits LYMPHATICS: No lymphadenopathy CARDIAC: RRR, no murmurs, no rubs, no gallops RESPIRATORY:  Clear to auscultation without rales, wheezing or rhonchi  ABDOMEN: Soft, non-tender, non-distended MUSCULOSKELETAL:  No edema; No deformity  SKIN: Warm and dry LOWER EXTREMITIES: no swelling NEUROLOGIC:  Alert and oriented x 3 PSYCHIATRIC:  Normal affect   ASSESSMENT:    1. 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 Left carotid bruit   3. Dyspnea on exertion   4. Chronic fatigue    PLAN:    In order of problems listed above:  Peripheral vascular disease stable.  Continue risk factors modifications. Left carotid bruit again stable peripheral vascular disease continue with antiplatelet therapy and statin. Dyslipidemia I did review K PN show me LDL 77 HDL 95.  Dose of Lipitor has been increased she does have follow-up appointment to have fasting lipid profile done which we will follow. Dyspnea exertion encouraged her to be more active.  We did spend great of time talking about need to exercise at least 5 times week 30 minutes moderate intensity exercise, we also talked about good diet we discussed basic of Mediterranean diet.   Medication Adjustments/Labs and Tests Ordered: Current medicines are reviewed at length with the patient today.  Concerns regarding medicines are outlined above.  No orders of the defined types were placed in this encounter.  Medication changes: No orders of the defined types were placed in this encounter.   Signed, Lamar DOROTHA Fitch, MD, [employer] 02/08/2024 8:30 AM    [facility]

## 2024-02-10 ENCOUNTER — Telehealth: Payer: Self-pay

## 2024-02-10 NOTE — Telephone Encounter (Signed)
 Pt viewed [place] results on My Chart per Dr. Vanetta Shawl note. Routed to PCP.

## 2024-05-30 DIAGNOSIS — L900 Lichen sclerosus et atrophicus: Secondary | ICD-10-CM | POA: Diagnosis not present

## 2024-05-30 DIAGNOSIS — N952 Postmenopausal atrophic vaginitis: Secondary | ICD-10-CM | POA: Diagnosis not present

## 2024-06-19 ENCOUNTER — Other Ambulatory Visit: Payer: Self-pay | Admitting: Obstetrics and Gynecology

## 2024-06-19 DIAGNOSIS — Z1231 Encounter for screening mammogram for malignant neoplasm of breast: Secondary | ICD-10-CM

## 2024-08-07 ENCOUNTER — Ambulatory Visit
Admission: RE | Admit: 2024-08-07 | Discharge: 2024-08-07 | Disposition: A | Discharge status: Home / Self Care | Source: Ambulatory Visit | Attending: Obstetrics and Gynecology | Admitting: Obstetrics and Gynecology

## 2024-08-07 DIAGNOSIS — Z1231 Encounter for screening mammogram for malignant neoplasm of breast: Secondary | ICD-10-CM
